# Patient Record
Sex: Female | Born: 1960 | Race: Black or African American | Hispanic: No | Marital: Married | Smoking: Former smoker
Health system: Southern US, Community
[De-identification: ages and names within clinical notes are randomized; demographics above are authoritative.]

## PROBLEM LIST (undated history)

## (undated) DIAGNOSIS — M1990 Unspecified osteoarthritis, unspecified site: Secondary | ICD-10-CM

## (undated) DIAGNOSIS — IMO0002 Reserved for concepts with insufficient information to code with codable children: Secondary | ICD-10-CM

## (undated) DIAGNOSIS — D649 Anemia, unspecified: Secondary | ICD-10-CM

## (undated) DIAGNOSIS — K859 Acute pancreatitis without necrosis or infection, unspecified: Secondary | ICD-10-CM

## (undated) DIAGNOSIS — E119 Type 2 diabetes mellitus without complications: Secondary | ICD-10-CM

## (undated) HISTORY — DX: Unspecified osteoarthritis, unspecified site: M19.90

## (undated) HISTORY — DX: Type 2 diabetes mellitus without complications: E11.9

## (undated) HISTORY — DX: Acute pancreatitis without necrosis or infection, unspecified: K85.90

## (undated) HISTORY — DX: Anemia, unspecified: D64.9

## (undated) HISTORY — DX: Reserved for concepts with insufficient information to code with codable children: IMO0002

## (undated) HISTORY — PX: OOPHORECTOMY: SHX86

---

## 2006-07-30 DIAGNOSIS — IMO0002 Reserved for concepts with insufficient information to code with codable children: Secondary | ICD-10-CM

## 2006-07-30 HISTORY — DX: Reserved for concepts with insufficient information to code with codable children: IMO0002

## 2007-07-31 HISTORY — PX: ABDOMINAL HYSTERECTOMY: SHX81

## 2007-12-15 ENCOUNTER — Other Ambulatory Visit: Payer: Self-pay

## 2007-12-15 ENCOUNTER — Ambulatory Visit: Payer: Self-pay | Admitting: Obstetrics and Gynecology

## 2007-12-18 ENCOUNTER — Inpatient Hospital Stay: Payer: Self-pay | Admitting: Obstetrics and Gynecology

## 2008-01-15 ENCOUNTER — Other Ambulatory Visit: Payer: Self-pay

## 2008-01-15 ENCOUNTER — Ambulatory Visit: Payer: Self-pay | Admitting: Obstetrics and Gynecology

## 2008-09-30 ENCOUNTER — Ambulatory Visit: Payer: Self-pay | Admitting: Family Medicine

## 2009-05-11 ENCOUNTER — Ambulatory Visit: Payer: Self-pay | Admitting: Gastroenterology

## 2010-07-26 ENCOUNTER — Ambulatory Visit: Payer: Self-pay | Admitting: Otolaryngology

## 2010-07-30 DIAGNOSIS — K859 Acute pancreatitis without necrosis or infection, unspecified: Secondary | ICD-10-CM

## 2010-07-30 HISTORY — DX: Acute pancreatitis without necrosis or infection, unspecified: K85.90

## 2011-02-28 ENCOUNTER — Ambulatory Visit: Payer: Self-pay | Admitting: Family Medicine

## 2012-03-18 ENCOUNTER — Ambulatory Visit: Payer: Self-pay | Admitting: Family Medicine

## 2012-04-01 ENCOUNTER — Ambulatory Visit: Payer: Self-pay | Admitting: Family Medicine

## 2013-01-07 ENCOUNTER — Ambulatory Visit: Payer: Self-pay | Admitting: Family Medicine

## 2013-10-05 ENCOUNTER — Ambulatory Visit: Payer: Self-pay | Admitting: Family Medicine

## 2014-02-03 ENCOUNTER — Encounter: Payer: Self-pay | Admitting: Family Medicine

## 2014-02-03 ENCOUNTER — Ambulatory Visit (INDEPENDENT_AMBULATORY_CARE_PROVIDER_SITE_OTHER): Payer: Federal, State, Local not specified - PPO

## 2014-02-03 ENCOUNTER — Ambulatory Visit (INDEPENDENT_AMBULATORY_CARE_PROVIDER_SITE_OTHER): Payer: Federal, State, Local not specified - PPO | Admitting: Family Medicine

## 2014-02-03 VITALS — BP 122/60 | HR 74 | Temp 98.1°F | Resp 16 | Ht 65.5 in | Wt 238.0 lb

## 2014-02-03 DIAGNOSIS — M940 Chondrocostal junction syndrome [Tietze]: Secondary | ICD-10-CM

## 2014-02-03 DIAGNOSIS — S161XXA Strain of muscle, fascia and tendon at neck level, initial encounter: Secondary | ICD-10-CM

## 2014-02-03 DIAGNOSIS — R51 Headache: Secondary | ICD-10-CM

## 2014-02-03 DIAGNOSIS — S139XXA Sprain of joints and ligaments of unspecified parts of neck, initial encounter: Secondary | ICD-10-CM

## 2014-02-03 DIAGNOSIS — R079 Chest pain, unspecified: Secondary | ICD-10-CM

## 2014-02-03 DIAGNOSIS — M542 Cervicalgia: Secondary | ICD-10-CM

## 2014-02-03 DIAGNOSIS — E119 Type 2 diabetes mellitus without complications: Secondary | ICD-10-CM

## 2014-02-03 MED ORDER — METHOCARBAMOL 500 MG PO TABS: 500.0000 mg | ORAL_TABLET | Freq: Every evening | ORAL | Status: DC | PRN

## 2014-02-03 MED ORDER — MELOXICAM 15 MG PO TABS: 15.0000 mg | ORAL_TABLET | Freq: Every day | ORAL | Status: DC

## 2014-02-03 NOTE — Patient Instructions (Signed)
Costochondritis Costochondritis, sometimes called Tietze syndrome, is a swelling and irritation (inflammation) of the tissue (cartilage) that connects your ribs with your breastbone (sternum). It causes pain in the chest and rib area. Costochondritis usually goes away on its own over time. It can take up to 6 weeks or longer to get better, especially if you are unable to limit your activities. CAUSES  Some cases of costochondritis have no known cause. Possible causes include:  Injury (trauma).  Exercise or activity such as lifting.  Severe coughing. SIGNS AND SYMPTOMS  Pain and tenderness in the chest and rib area.  Pain that gets worse when coughing or taking deep breaths.  Pain that gets worse with specific movements. DIAGNOSIS  Your health care provider will do a physical exam and ask about your symptoms. Chest X-rays or other tests may be done to rule out other problems. TREATMENT  Costochondritis usually goes away on its own over time. Your health care provider may prescribe medicine to help relieve pain. HOME CARE INSTRUCTIONS   Avoid exhausting physical activity. Try not to strain your ribs during normal activity. This would include any activities using chest, abdominal, and side muscles, especially if heavy weights are used.  Apply ice to the affected area for the first 2 days after the pain begins.  Put ice in a plastic bag.  Place a towel between your skin and the bag.  Leave the ice on for 20 minutes, 2-3 times a day.  Only take over-the-counter or prescription medicines as directed by your health care provider. SEEK MEDICAL CARE IF:  You have redness or swelling at the rib joints. These are signs of infection.  Your pain does not go away despite rest or medicine. SEEK IMMEDIATE MEDICAL CARE IF:   Your pain increases or you are very uncomfortable.  You have shortness of breath or difficulty breathing.  You cough up blood.  You have worse chest pains,  sweating, or vomiting.  You have a fever or persistent symptoms for more than 2-3 days.  You have a fever and your symptoms suddenly get worse. MAKE SURE YOU:   Understand these instructions.  Will watch your condition.  Will get help right away if you are not doing well or get worse. Document Released: 04/25/2005 Document Revised: 05/06/2013 Document Reviewed: 02/17/2013 Exit[employer] Patient Information 2015 [employer]. This information is not intended to replace advice given to you by your health care provider. Make sure you discuss any questions you have with your health care provider.  

## 2014-02-03 NOTE — Progress Notes (Signed)
This chart was scribed for Ethelda Chick, MD by Ardelia Mems, Scribe. This patient was seen in room 21 and the patient's care was started at 2:49 PM.  Subjective:    Patient ID: Suzanne Young, female    DOB: 1961/06/23, 53 y.o.   MRN: [id number]  02/03/2014  Headache and Chest Pain   HPI  HPI Comments: Suzanne Young is a 53 y.o. female who presents to [facility] to establish care and complaining of an occipital headache for the past 1+ month. She states that the headache was at first intermittent but it is now constant. She describes this pain as pressure and as a numbing pain. She rates the pain at a severity of 1-2/10. She denies having any rash to her scalp. She denies any associated blurry vision or double vision. She denies any difficulty swallowing or speaking.  She does report associated neck pain.  No radiation of pain into arms. No n/t/w in arms. No blurred vision; denies dizziness; no focal weakness.    She has a secondary complaint of intermittent, mild left-sided chest pain over the past month. She rates her pain at a severity of 1-2/10. She describes this pain as "burning". She states that she has had associated nausea over the past month but denies any vomiting. She states that she has been evaluated for this pain by Dr. Dorinda Hill Fisher/PCP- she had a normal EKG and was diagnosed with heartburn. She states that she tried Nexium for 2-3 weeks with some minimal relief of this pain. She is no longer taking Nexium because it did not provide significant enough relief. She states that she occasionally has exertional SOB. She states that she has had a cough recently. She denies any postnasal drip or congestion. She also denies any leg swelling, but notes she occasionally has pain in her calves. She states that she has not had any recent surgeries or prolonged immobilization or sedentary lifestyle.  Chest pain worsens after eating; chest pain also worsens  with position changes like when getting out of bed in the morning or with dressing and bending over.  At recent visit with Dr. Sherrie Mustache, he diagnosed her with diabetes and started her on Metformin. Patient was not fasting for the recent visit with Sherrie Mustache so does not believe the diagnosis.  She however did continue the Metformin because a nurse friend told her that some people lose weight on Metformin.  She is tolerating it without side effects.   She states that she was admitted to the hospital for pancreatitis 2-3 years ago. She also has a history of a gastric ulcer in 2008.   She states that her mother died at age 38 of unknown causes. She states that her mother had a stroke as well as a history of mental illness. She states that her father died of an aneurysm at age 55. She is unsure if this was an abdominal or a brain aneurysm.  She states that her father had no other health problems that she knows of. She states that she has a brother who is healthy with the exception of a stomach issue. She states that she has 2 sisters.   She has been happily married since 2008. She lives wit her husband. She has a son who is 29. 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 She is a former smoker and drinker who quit both smoking and drinking in 1997.    Review of Systems  Constitutional: Negative for fever, chills, diaphoresis, activity change, appetite change and fatigue.  HENT: Negative for congestion, postnasal drip and trouble swallowing.   Eyes: Negative for visual disturbance.  Respiratory: Positive for shortness of breath (occasional, exertional). Negative for cough and stridor.   Cardiovascular: Positive for chest pain. Negative for palpitations and leg swelling.  Gastrointestinal: Positive for nausea. Negative for vomiting, abdominal pain, diarrhea, constipation, blood in stool and rectal pain.  Endocrine: Negative for cold intolerance, heat intolerance, polydipsia, polyphagia and polyuria.    Skin: Negative for rash and wound.  Neurological: Positive for headaches. Negative for dizziness, tremors, seizures, syncope, facial asymmetry, speech difficulty, weakness, light-headedness and numbness.    Past Medical History  Diagnosis Date  . Pancreatitis 07/30/2010  . Anemia   . Ulcer 07/30/2006  . Diabetes mellitus without complication   History reviewed. No pertinent past surgical history.  No Known Allergies Current Outpatient Prescriptions  Medication Sig Dispense Refill  . B Complex Vitamins (B COMPLEX PO) Take by mouth daily.      . metFORMIN (GLUCOPHAGE-XR) 500 MG 24 hr tablet Take 500 mg by mouth daily with breakfast.      . Multiple Vitamin (MULTIVITAMIN) tablet Take 1 tablet by mouth daily.      . meloxicam (MOBIC) 15 MG tablet Take 1 tablet (15 mg total) by mouth daily.  30 tablet  0  . methocarbamol (ROBAXIN) 500 MG tablet Take 1-2 tablets (500-1,000 mg total) by mouth at bedtime as needed for muscle spasms.  40 tablet  0   No current facility-administered medications for this visit.   History   Social History  . Marital Status: Married    Spouse Name: N/A    Number of Children: N/A  . Years of Education: N/A   Occupational History  . Not on file.   Social History Main Topics  . Smoking status: Former Smoker -- 0.50 packs/day for 15 years    Types: Cigarettes    Quit date: 06/30/1995  . Smokeless tobacco: Not on file  . Alcohol Use: No  . Drug Use: Not on file  . Sexual Activity: Not on file   Other Topics Concern  . Not on file   Social History Narrative   Marital status: married since 2008.      Children: 1 son (32).      Lives: with husband.      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        Tobacco: none; quit in 1997      Alcohol:  None; quit 1997.      Exercise: regularly.         Family History  Problem Relation Age of Onset  . Stroke Mother   . Mental illness Mother   . Mental illness Brother         Objective:    BP 122/60  Pulse 74  Temp(Src) 98.1 F (36.7 C) (Oral)  Resp 16  Ht 5' 5.5" (1.664 m)  Wt 238 lb (107.956 kg)  BMI 38.99 kg/m2  SpO2 97%  Physical Exam  Nursing note and vitals reviewed. Constitutional: She is oriented to person, place, and time. She appears well-developed and well-nourished. No distress.  HENT:  Head: Normocephalic and atraumatic.  Right Ear: External ear normal.  Left Ear: External ear normal.  Nose: Nose normal.  Mouth/Throat: Oropharynx is clear and moist.  Tender to palpation along the occipital ridge. Temples non-tender  Eyes: Conjunctivae  and EOM are normal. Pupils are equal, round, and reactive to light.  Neck: Normal range of motion. Neck supple. Carotid bruit is not present. No tracheal deviation present. No thyromegaly present.  Cardiovascular: Normal rate, regular rhythm, normal heart sounds and intact distal pulses.  Exam reveals no gallop and no friction rub.   No murmur heard. Pulmonary/Chest: Effort normal and breath sounds normal. No respiratory distress. She has no wheezes. She has no rales. She exhibits tenderness.  Lungs clear to auscultation Tender to palpation at the left costochondral junction  Abdominal: Soft. Bowel sounds are normal. She exhibits no distension and no mass. There is no tenderness. There is no rebound and no guarding.  Musculoskeletal: Normal range of motion. She exhibits tenderness.       Right shoulder: Normal.       Left shoulder: Normal.       Cervical back: She exhibits tenderness, bony tenderness and pain. She exhibits normal range of motion and no spasm.       Lumbar back: She exhibits normal range of motion, no tenderness, no bony tenderness, no swelling, no pain and no spasm.  Right trapezius tenderness  Lymphadenopathy:    She has no cervical adenopathy.  Neurological: She is alert and oriented to person, place, and time. No cranial nerve deficit.  Skin: Skin is warm and dry. No rash noted. She  is not diaphoretic. No erythema. No pallor.  Psychiatric: She has a normal mood and affect. Her behavior is normal.   No results found for this or any previous visit.  [facility] reading (PRIMARY) by  Dr. Katrinka Blazing.  CXR: NAD;  CERVICAL SPINE FILMS: DEGENERATIVE CHANGES MULTILEVEL.       Assessment & Plan:  Neck pain - Plan: DG Cervical Spine Complete  Headache(784.0)  Chest pain, unspecified chest pain type - Plan: DG Chest 2 View  Costochondritis  Neck strain, initial encounter  Type II or unspecified type diabetes mellitus without mention of complication, not stated as uncontrolled  1.  Headache:  New.  Occipital region; associated with neck pain.  Consistent with neck strain.  Normal neurological exam in office. Rx for Meloxicam and Robaxin; home exercise program provided. 2. Neck pain/DDD cervical spine: New.  With associated headaches.  Rx for Meloxicam and Robaxin provided; recommend passive ROM of neck daily; also recommend heat to neck bid for 15-20 minutes.  If no improvement in 2-3 weeks, refer to ortho. 3.  Chest pain:  New.  Worsens with position changes.  Tenderness at L costochondral junction; consistent with chest wall strain/costochondritis. No exertional component to suggest cardiac etiology. 4. Costochondritis:  New.  Rx for Meloxicam and Robaxin provided.   5.  DMII: New onset; obtain records; RTC 3 weeks for CPE for fasting labs. Continue Metformin.  Meds ordered this encounter  Medications  . metFORMIN (GLUCOPHAGE-XR) 500 MG 24 hr tablet    Sig: Take 500 mg by mouth daily with breakfast.  . Multiple Vitamin (MULTIVITAMIN) tablet    Sig: Take 1 tablet by mouth daily.  . B Complex Vitamins (B COMPLEX PO)    Sig: Take by mouth daily.  . meloxicam (MOBIC) 15 MG tablet    Sig: Take 1 tablet (15 mg total) by mouth daily.    Dispense:  30 tablet    Refill:  0  . methocarbamol (ROBAXIN) 500 MG tablet    Sig: Take 1-2 tablets (500-1,000 mg total) by mouth at bedtime as  needed for muscle spasms.    Dispense:  40 tablet    Refill:  0    No Follow-up on file.   I personally performed the services described in this documentation, which was scribed in my presence.  The recorded information has been reviewed and is accurate.  Nilda SimmerKristi Smith, M.D.  [facility]  [place] [address] [place], [place]  [id number] [phone] phone [phone] fax

## 2014-02-24 ENCOUNTER — Ambulatory Visit (INDEPENDENT_AMBULATORY_CARE_PROVIDER_SITE_OTHER): Payer: Federal, State, Local not specified - PPO | Admitting: Family Medicine

## 2014-02-24 ENCOUNTER — Encounter: Payer: Self-pay | Admitting: Family Medicine

## 2014-02-24 VITALS — BP 142/80 | HR 65 | Temp 98.0°F | Resp 16 | Ht 65.0 in | Wt 239.0 lb

## 2014-02-24 DIAGNOSIS — R7309 Other abnormal glucose: Secondary | ICD-10-CM

## 2014-02-24 DIAGNOSIS — Z23 Encounter for immunization: Secondary | ICD-10-CM

## 2014-02-24 DIAGNOSIS — Z01419 Encounter for gynecological examination (general) (routine) without abnormal findings: Secondary | ICD-10-CM

## 2014-02-24 DIAGNOSIS — R51 Headache: Secondary | ICD-10-CM

## 2014-02-24 DIAGNOSIS — Z13220 Encounter for screening for lipoid disorders: Secondary | ICD-10-CM

## 2014-02-24 DIAGNOSIS — E119 Type 2 diabetes mellitus without complications: Secondary | ICD-10-CM

## 2014-02-24 DIAGNOSIS — Z0000 Encounter for general adult medical examination without abnormal findings: Secondary | ICD-10-CM

## 2014-02-24 DIAGNOSIS — R0782 Intercostal pain: Secondary | ICD-10-CM

## 2014-02-24 DIAGNOSIS — R0789 Other chest pain: Secondary | ICD-10-CM

## 2014-02-24 DIAGNOSIS — M542 Cervicalgia: Secondary | ICD-10-CM

## 2014-02-24 LAB — POCT URINALYSIS DIPSTICK
Bilirubin, UA: NEGATIVE
Blood, UA: NEGATIVE
GLUCOSE UA: NEGATIVE
KETONES UA: NEGATIVE
LEUKOCYTES UA: NEGATIVE
Nitrite, UA: NEGATIVE
PROTEIN UA: NEGATIVE
Spec Grav, UA: <=1.005
Urobilinogen, UA: 0.2
pH, UA: 5

## 2014-02-24 LAB — HEMOGLOBIN A1C
HEMOGLOBIN A1C: 7.3 % — AB (ref <5.7)
Mean Plasma Glucose: 163 mg/dL — ABNORMAL HIGH (ref <117)

## 2014-02-24 LAB — CBC
HEMATOCRIT: 37.8 % (ref 36.0–46.0)
Hemoglobin: 12.9 g/dL (ref 12.0–15.0)
MCH: 29.1 pg (ref 26.0–34.0)
MCHC: 34.1 g/dL (ref 30.0–36.0)
MCV: 85.1 fL (ref 78.0–100.0)
Platelets: 338 10*3/uL (ref 150–400)
RBC: 4.44 MIL/uL (ref 3.87–5.11)
RDW: 14.2 % (ref 11.5–15.5)
WBC: 3.8 10*3/uL — AB (ref 4.0–10.5)
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
## 2014-02-25 LAB — COMPREHENSIVE METABOLIC PANEL
ALT: 27 U/L (ref 0–35)
AST: 17 U/L (ref 0–37)
Albumin: 4.4 g/dL (ref 3.5–5.2)
Alkaline Phosphatase: 57 U/L (ref 39–117)
BUN: 12 mg/dL (ref 6–23)
CALCIUM: 9.7 mg/dL (ref 8.4–10.5)
CO2: 26 meq/L (ref 19–32)
Chloride: 101 mEq/L (ref 96–112)
Creat: 0.67 mg/dL (ref 0.50–1.10)
Glucose, Bld: 164 mg/dL — ABNORMAL HIGH (ref 70–99)
Potassium: 4.1 mEq/L (ref 3.5–5.3)
SODIUM: 138 meq/L (ref 135–145)
TOTAL PROTEIN: 7.5 g/dL (ref 6.0–8.3)
Total Bilirubin: 0.4 mg/dL (ref 0.2–1.2)

## 2014-02-25 LAB — LIPID PANEL
Cholesterol: 227 mg/dL — ABNORMAL HIGH (ref 0–200)
HDL: 58 mg/dL (ref >39)
LDL CALC: 141 mg/dL — AB (ref 0–99)
Total CHOL/HDL Ratio: 3.9 Ratio
Triglycerides: 138 mg/dL (ref <150)
VLDL: 28 mg/dL (ref 0–40)

## 2014-02-25 LAB — TSH: TSH: 0.707 u[IU]/mL (ref 0.350–4.500)

## 2014-03-02 ENCOUNTER — Encounter: Payer: Self-pay | Admitting: *Deleted

## 2014-03-02 NOTE — Addendum Note (Signed)
Addended by: Ethelda ChickSMITH, KRISTI M on: 03/02/2014 12:24 PM   Modules accepted: Orders

## 2014-03-12 LAB — HM MAMMOGRAPHY: HM Mammogram: NEGATIVE

## 2014-03-18 ENCOUNTER — Telehealth: Payer: Self-pay

## 2014-03-18 NOTE — Telephone Encounter (Signed)
Called and informed pt of normal mammogram.

## 2014-03-26 ENCOUNTER — Encounter: Payer: Self-pay | Admitting: Radiology

## 2014-05-24 ENCOUNTER — Encounter: Payer: Federal, State, Local not specified - PPO | Admitting: Family Medicine

## 2014-05-27 NOTE — Progress Notes (Signed)
This encounter was created in error - please disregard.

## 2015-10-18 ENCOUNTER — Ambulatory Visit (INDEPENDENT_AMBULATORY_CARE_PROVIDER_SITE_OTHER): Payer: Federal, State, Local not specified - PPO | Admitting: Family Medicine

## 2015-10-18 ENCOUNTER — Encounter: Payer: Self-pay | Admitting: Family Medicine

## 2015-10-18 VITALS — BP 127/84 | HR 79 | Temp 98.4°F | Resp 16 | Ht 65.0 in | Wt 237.0 lb

## 2015-10-18 DIAGNOSIS — Z13220 Encounter for screening for lipoid disorders: Secondary | ICD-10-CM

## 2015-10-18 DIAGNOSIS — Z0000 Encounter for general adult medical examination without abnormal findings: Secondary | ICD-10-CM

## 2015-10-18 DIAGNOSIS — Z1239 Encounter for other screening for malignant neoplasm of breast: Secondary | ICD-10-CM

## 2015-10-18 DIAGNOSIS — E119 Type 2 diabetes mellitus without complications: Secondary | ICD-10-CM | POA: Diagnosis not present

## 2015-10-18 DIAGNOSIS — Z23 Encounter for immunization: Secondary | ICD-10-CM | POA: Diagnosis not present

## 2015-10-18 DIAGNOSIS — Z1159 Encounter for screening for other viral diseases: Secondary | ICD-10-CM | POA: Diagnosis not present

## 2015-10-18 DIAGNOSIS — Z114 Encounter for screening for human immunodeficiency virus [HIV]: Secondary | ICD-10-CM

## 2015-10-18 LAB — POCT URINALYSIS DIP (MANUAL ENTRY)
Bilirubin, UA: NEGATIVE
GLUCOSE UA: NEGATIVE
Ketones, POC UA: NEGATIVE
Leukocytes, UA: NEGATIVE
NITRITE UA: NEGATIVE
PH UA: 6.5
PROTEIN UA: NEGATIVE
Spec Grav, UA: 1.02
Urobilinogen, UA: 0.2

## 2015-10-18 LAB — CBC WITH DIFFERENTIAL/PLATELET
BASOS ABS: 0 10*3/uL (ref 0.0–0.1)
Basophils Relative: 0 % (ref 0–1)
Eosinophils Absolute: 0.1 10*3/uL (ref 0.0–0.7)
Eosinophils Relative: 2 % (ref 0–5)
HEMATOCRIT: 36.7 % (ref 36.0–46.0)
HEMOGLOBIN: 12.5 g/dL (ref 12.0–15.0)
LYMPHS ABS: 2 10*3/uL (ref 0.7–4.0)
LYMPHS PCT: 52 % — AB (ref 12–46)
MCH: 28.7 pg (ref 26.0–34.0)
MCHC: 34.1 g/dL (ref 30.0–36.0)
MCV: 84.4 fL (ref 78.0–100.0)
MPV: 9.3 fL (ref 8.6–12.4)
Monocytes Absolute: 0.3 10*3/uL (ref 0.1–1.0)
Monocytes Relative: 9 % (ref 3–12)
NEUTROS ABS: 1.4 10*3/uL — AB (ref 1.7–7.7)
Neutrophils Relative %: 37 % — ABNORMAL LOW (ref 43–77)
Platelets: 297 10*3/uL (ref 150–400)
RBC: 4.35 MIL/uL (ref 3.87–5.11)
RDW: 13.8 % (ref 11.5–15.5)
WBC: 3.8 10*3/uL — AB (ref 4.0–10.5)

## 2015-10-18 LAB — POCT GLYCOSYLATED HEMOGLOBIN (HGB A1C): HEMOGLOBIN A1C: 9.1

## 2015-10-18 LAB — TSH: TSH: 0.56 mIU/L

## 2015-10-18 NOTE — Patient Instructions (Addendum)
   IF you received an x-ray today, you will receive an invoice from [facility]. Please contact [facility] at [phone] with questions or concerns regarding your invoice.   IF you received labwork today, you will receive an invoice from [facility]. Please contact [facility] at [phone] with questions or concerns regarding your invoice.   Our billing staff will not be able to assist you with questions regarding bills from these companies.  You will be contacted with the lab results as soon as they are available. The fastest way to get your results is to activate your My Chart account. Instructions are located on the last page of this paperwork. If you have not heard from us regarding the results in 2 weeks, please contact this office.    Keeping You Healthy  Get These Tests  Blood Pressure- Have your blood pressure checked by your healthcare provider at least once a year.  Normal blood pressure is 120/80.  Weight- Have your body mass index (BMI) calculated to screen for obesity.  BMI is a measure of body fat based on height and weight.  You can calculate your own BMI at www.nhlbisupport.com/bmi/  Cholesterol- Have your cholesterol checked every year.  Diabetes- Have your blood sugar checked every year if you have high blood pressure, high cholesterol, a family history of diabetes or if you are overweight.  Pap Test - Have a pap test every 1 to 5 years if you have been sexually active.  If you are older than 65 and recent pap tests have been normal you may not need additional pap tests.  In addition, if you have had a hysterectomy  for benign disease additional pap tests are not necessary.  Mammogram-Yearly mammograms are essential for early detection of breast cancer  Screening for Colon Cancer- Colonoscopy starting at age 50. Screening may begin sooner depending on your family history and other health conditions.  Follow up colonoscopy  as directed by your Gastroenterologist.  Screening for Osteoporosis- Screening begins at age 65 with bone density scanning, sooner if you are at higher risk for developing Osteoporosis.  Get these medicines  Calcium with Vitamin D- Your body requires 1200-1500 mg of Calcium a day and 800-1000 IU of Vitamin D a day.  You can only absorb 500 mg of Calcium at a time therefore Calcium must be taken in 2 or 3 separate doses throughout the day.  Hormones- Hormone therapy has been associated with increased risk for certain cancers and heart disease.  Talk to your healthcare provider about if you need relief from menopausal symptoms.  Aspirin- Ask your healthcare provider about taking Aspirin to prevent Heart Disease and Stroke.  Get these Immuniztions  Flu shot- Every fall  Pneumonia shot- Once after the age of 65; if you are younger ask your healthcare provider if you need a pneumonia shot.  Tetanus- Every ten years.  Zostavax- Once after the age of 60 to prevent shingles.  Take these steps  Don't smoke- Your healthcare provider can help you quit. For tips on how to quit, ask your healthcare provider or go to www.smokefree.gov or call 1-800 QUIT-NOW.  Be physically active- Exercise 5 days a week for a minimum of 30 minutes.  If you are not already physically active, start slow and gradually work up to 30 minutes of moderate physical activity.  Try walking, dancing, bike riding, swimming, etc.  Eat a healthy diet- Eat a variety of healthy foods such as fruits, vegetables, whole   grains, low fat milk, low fat cheeses, yogurt, lean meats, chicken, fish, eggs, dried beans, tofu, etc.  For more information go to www.thenutritionsource.org  Dental visit- Brush and floss teeth twice daily; visit your dentist twice a year.  Eye exam- Visit your Optometrist or Ophthalmologist yearly.  Drink alcohol in moderation- Limit alcohol intake to one drink or less a day.  Never drink and  drive.  Depression- Your emotional health is as important as your physical health.  If you're feeling down or losing interest in things you normally enjoy, please talk to your healthcare provider.  Seat Belts- can save your life; always wear one  Smoke/Carbon Monoxide detectors- These detectors need to be installed on the appropriate level of your home.  Replace batteries at least once a year.  Violence- If anyone is threatening or hurting you, please tell your healthcare provider.  Living Will/ Health care power of attorney- Discuss with your healthcare provider and family. 
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
## 2015-10-18 NOTE — Progress Notes (Signed)
   Subjective:    Patient ID: Suzanne Young, female    DOB: 12/20/1960, 55 y.o.   MRN: [id number]  HPI    Review of Systems  Constitutional: Positive for diaphoresis and fatigue.  HENT: Negative.   Eyes: Positive for visual disturbance.  Respiratory: Negative.   Cardiovascular: Negative.   Gastrointestinal: Negative.   Endocrine: Negative.   Genitourinary: Positive for vaginal pain.  Musculoskeletal: Positive for arthralgias.  Skin: Negative.   Allergic/Immunologic: Negative.   Neurological: Negative.   Hematological: Negative.   Psychiatric/Behavioral: Negative.        Objective:   Physical Exam        Assessment & Plan:

## 2015-10-19 LAB — HEPATITIS C ANTIBODY: HCV AB: NEGATIVE

## 2015-10-19 LAB — MICROALBUMIN, URINE: Microalb, Ur: 0.2 mg/dL

## 2015-10-19 LAB — LIPID PANEL
CHOLESTEROL: 216 mg/dL — AB (ref 125–200)
HDL: 47 mg/dL (ref >=46)
LDL CALC: 142 mg/dL — AB (ref <130)
TRIGLYCERIDES: 135 mg/dL (ref <150)
Total CHOL/HDL Ratio: 4.6 Ratio (ref <=5.0)
VLDL: 27 mg/dL (ref <30)

## 2015-10-19 LAB — COMPREHENSIVE METABOLIC PANEL
ALBUMIN: 4.2 g/dL (ref 3.6–5.1)
ALK PHOS: 63 U/L (ref 33–130)
ALT: 20 U/L (ref 6–29)
AST: 14 U/L (ref 10–35)
BILIRUBIN TOTAL: 0.5 mg/dL (ref 0.2–1.2)
BUN: 11 mg/dL (ref 7–25)
CALCIUM: 9.1 mg/dL (ref 8.6–10.4)
CO2: 23 mmol/L (ref 20–31)
Chloride: 102 mmol/L (ref 98–110)
Creat: 0.61 mg/dL (ref 0.50–1.05)
Glucose, Bld: 188 mg/dL — ABNORMAL HIGH (ref 65–99)
Potassium: 3.9 mmol/L (ref 3.5–5.3)
Sodium: 137 mmol/L (ref 135–146)
TOTAL PROTEIN: 7.1 g/dL (ref 6.1–8.1)

## 2015-10-19 LAB — HIV ANTIBODY (ROUTINE TESTING W REFLEX): HIV 1&2 Ab, 4th Generation: NONREACTIVE

## 2015-10-23 IMAGING — CR DG CERVICAL SPINE COMPLETE 4+V
4 series · 4 of 4 positions shown · non-contrast
Comparison: None.

CLINICAL DATA: Neck pain

EXAM:
CERVICAL SPINE  4+ VIEWS

[lateral]
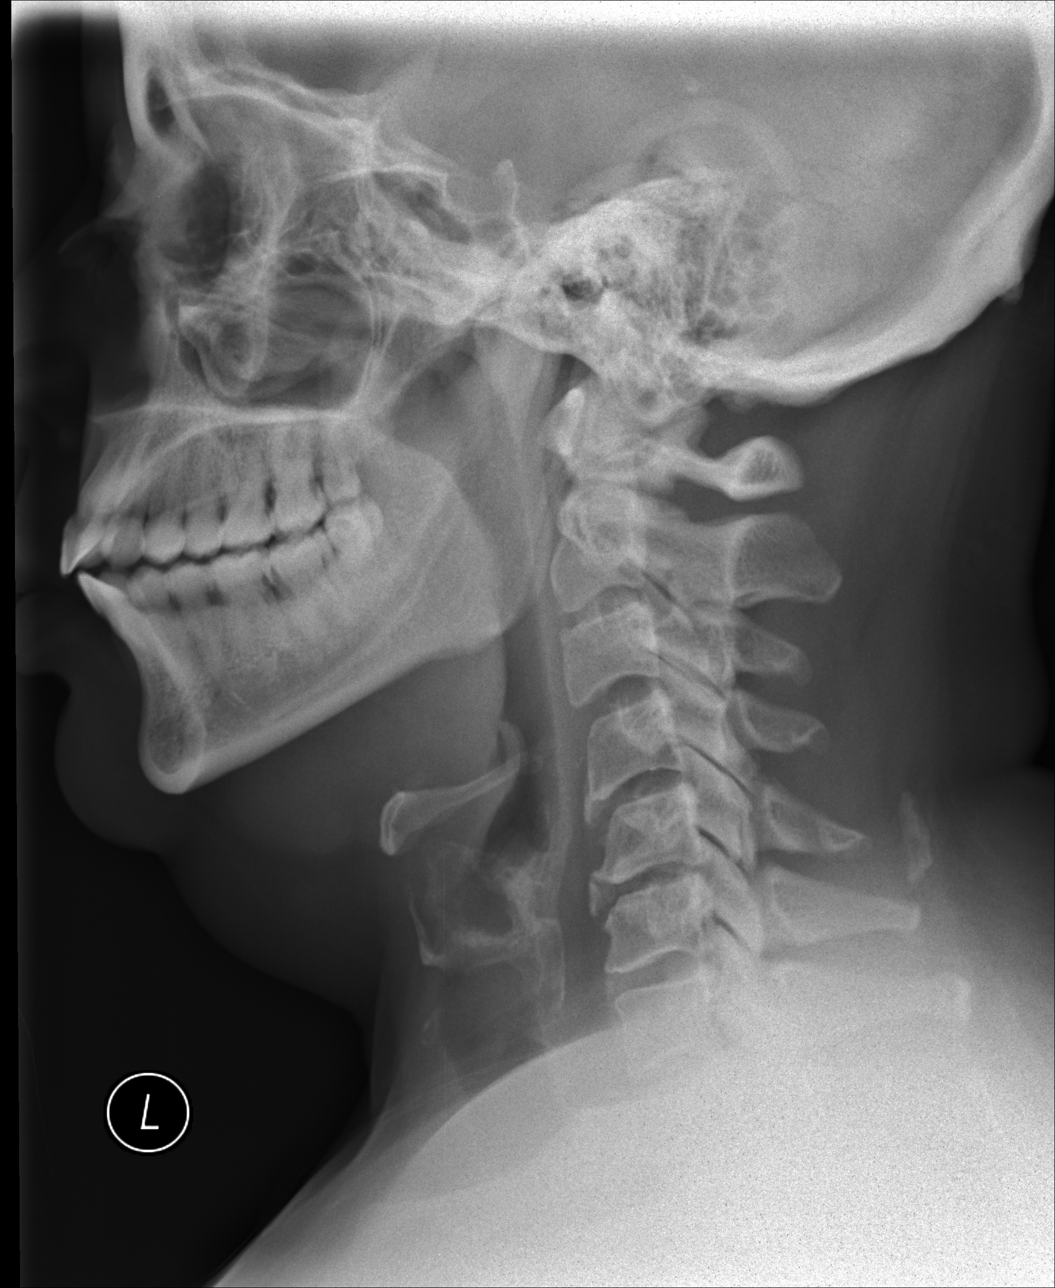

[oblique (1 of 2)]
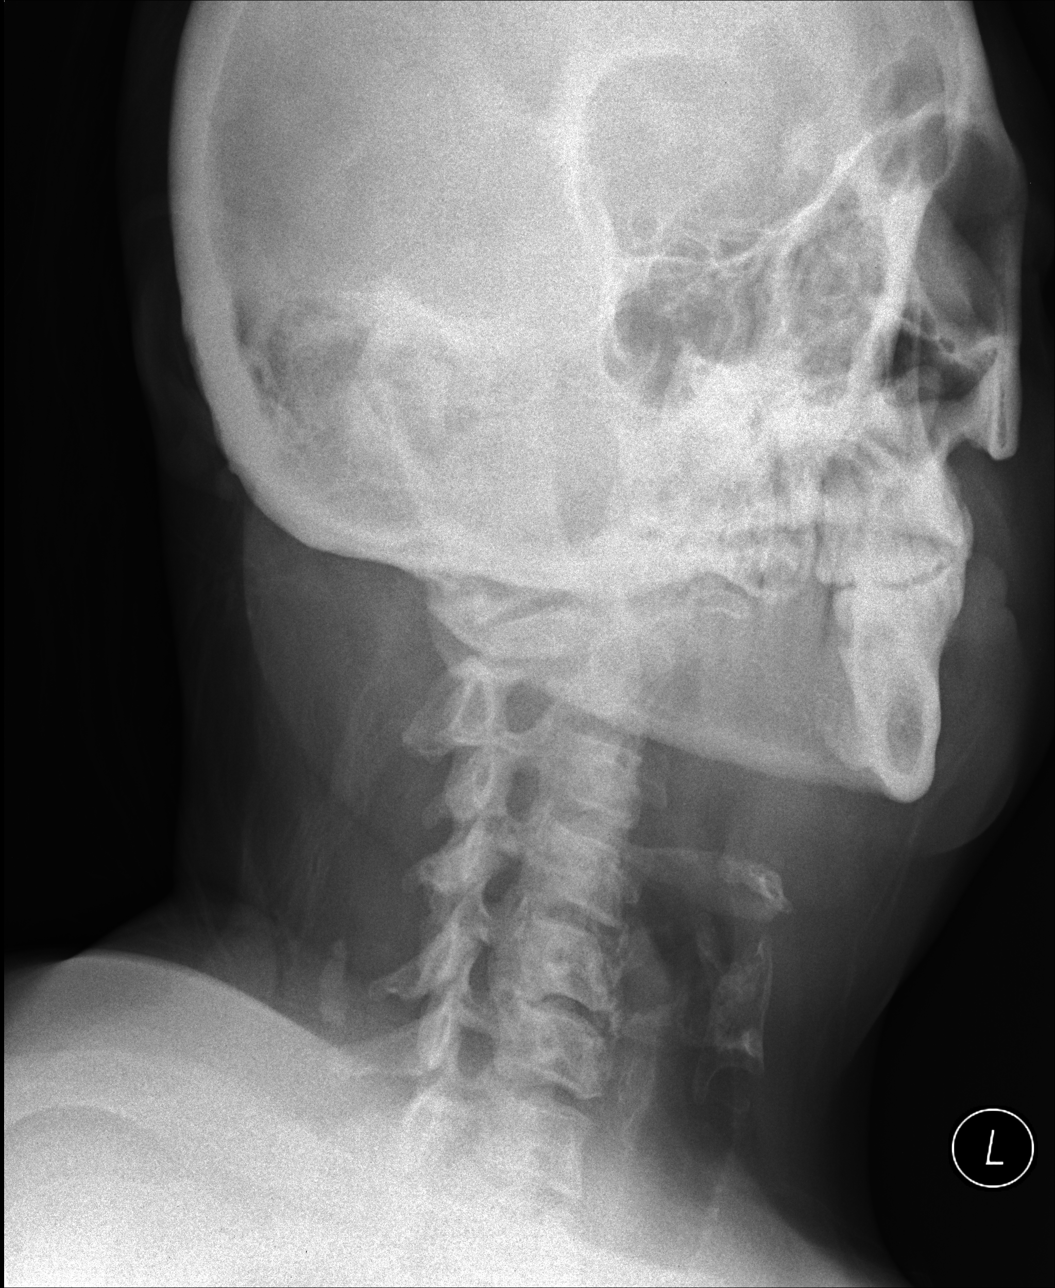

[AP]
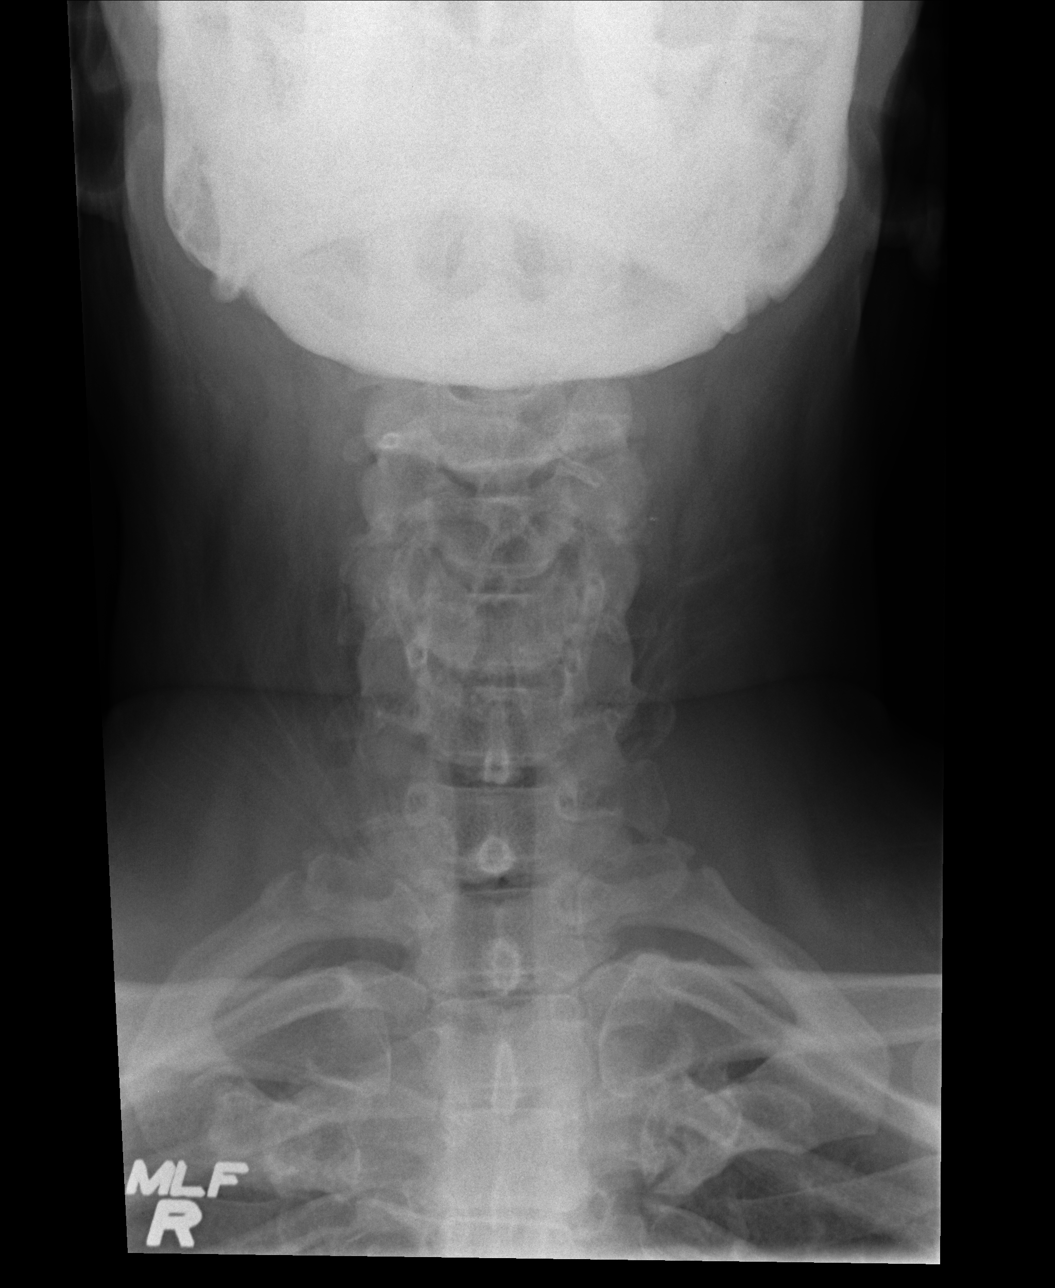

[oblique (2 of 2)]
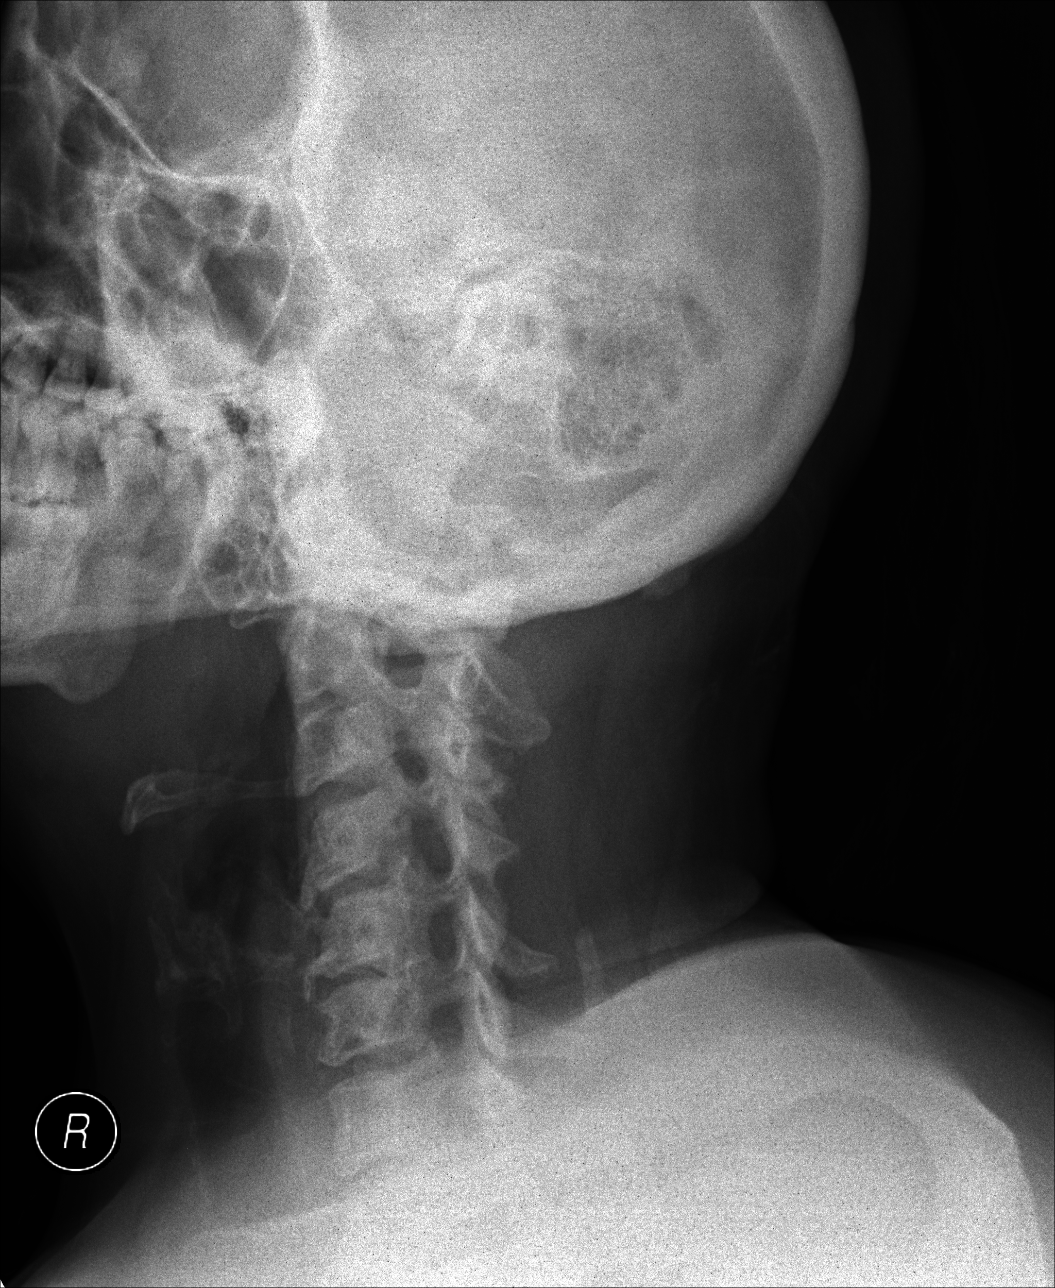

[4 of 4 positions shown; findings below may reference images not displayed]

FINDINGS: Frontal, lateral, and bilateral oblique views were obtained. There
is no fracture or spondylolisthesis. Prevertebral soft tissues and
predental space regions are normal. There is moderate disc space
narrowing at C5-6. There is milder disc space narrowing at C4-5.
There are calcifications in the anterior ligament at C4-5 and C5-6.
There is calcification in the nuchal ligament posteriorly at C5-6.
There is facet osteoarthritic change at C4-5 and C5-6 bilaterally.
IMPRESSION: Osteoarthritic change at several levels. No fracture or
spondylolisthesis.

## 2015-10-31 MED ORDER — METFORMIN HCL ER (MOD) 500 MG PO TB24: 500.0000 mg | ORAL_TABLET | Freq: Two times a day (BID) | ORAL | Status: DC

## 2015-10-31 NOTE — Addendum Note (Signed)
Addended by: Ethelda ChickSMITH, Tadarius Maland M on: 10/31/2015 10:06 PM   Modules accepted: Orders

## 2015-11-02 ENCOUNTER — Other Ambulatory Visit: Payer: Self-pay

## 2015-11-02 MED ORDER — METFORMIN HCL ER 500 MG PO TB24: 500.0000 mg | ORAL_TABLET | Freq: Two times a day (BID) | ORAL | Status: DC

## 2015-11-15 ENCOUNTER — Encounter: Payer: Federal, State, Local not specified - PPO | Attending: Family Medicine | Admitting: Dietician

## 2015-11-15 VITALS — BP 130/82 | Ht 65.0 in | Wt 235.7 lb

## 2015-11-15 DIAGNOSIS — E119 Type 2 diabetes mellitus without complications: Secondary | ICD-10-CM

## 2015-11-15 NOTE — Patient Instructions (Signed)
  Check blood sugars 2 x day before breakfast and 2 hrs after supper every day  Exercise:  Continue walking 5x/wk.   Avoid sugar sweetened drinks (soda, tea, coffee, sports drinks, juices) Limit intake of sweets and fried foods  Eat 3 meals day,   3  snacks a day  Space meals 4-6 hours apart  Bring blood sugar records to the next appointment/class  Call your doctor for a prescription for:  1. Meter strips (type)  Ultra One Touch test strips  checking  2 times per day  2. Lancets (type) One Touch Delica Lancets  checking  2   times per day  Get a Sharps container  Return for appointment/classes on:  12-01-15  Call if questions arise

## 2015-11-15 NOTE — Progress Notes (Signed)
Diabetes Self-Management Education  Visit Type: First/Initial  Appt. Start Time: 1040 Appt. End Time: 1145  11/15/2015  Ms. Suzanne Young, identified by name and date of birth, is a 55 y.o. female with a diagnosis of Diabetes: Type 2.   ASSESSMENT  Blood pressure 130/82, height  (1.651 m), weight 235 lb 11.2 oz (106.913 kg). Body mass index is 39.22 kg/(m^2). Lacks knowledge and understnading of diabetes care and diet Obesity Does not have a BG meter to test BG's      Diabetes Self-Management Education - 11/15/15 1217    Visit Information   Visit Type First/Initial   Initial Visit   Diabetes Type Type 2   Health Coping   How would you rate your overall health? Good   Psychosocial Assessment   Patient Belief/Attitude about Diabetes Motivated to manage diabetes   Self-care barriers None   Patient Concerns Nutrition/Meal planning;Glycemic Control;Weight Control;Healthy Lifestyle  become more fit   Special Needs None   Preferred Learning Style Visual   Learning Readiness Ready   What is the last grade level you completed in school? 12+ college   Complications   How often do you check your blood sugar? 0 times/day (not testing)   Have you had a dilated eye exam in the past 12 months? Yes  01-2015   Have you had a dental exam in the past 12 months? Yes  08-2015   Are you checking your feet? Yes   How many days per week are you checking your feet? 7   Dietary Intake   Breakfast --  pt recently decreased intake of fried foods and sweets and increased intake of vegetables; eats  breakfast at 6am before starting work ([employer])    Snack (morning) --  eats 9am snack of apple + 1 plum   Lunch --  eats lunch at 11am-usually drinks a protein shake (has 6 carb grams and 20 grams of protein)   Snack (afternoon) --  eats snacks at 12p and 1P (fruit or small amount of wheat thins)   Dinner --  supper time varies: 3p-6p   Snack (evening) --  none   Beverage(s) --  drinks 8+ glasses of water/day and 8+ glasses ofsugar free beverages/day   Exercise   Exercise Type ADL's  walks 30-60 min 5x/wk   Patient Education   Previous Diabetes Education No   Disease state  Definition of diabetes, type 1 and 2, and the diagnosis of diabetes;Factors that contribute to the development of diabetes   Nutrition management  Role of diet in the treatment of diabetes and the relationship between the three main macronutrients and blood glucose level;Food label reading, portion sizes and measuring food.;Carbohydrate counting   Physical activity and exercise  Role of exercise on diabetes management, blood pressure control and cardiac health.   Monitoring Taught/evaluated SMBG meter.;Purpose and frequency of SMBG.;Identified appropriate SMBG and/or A1C goals.;Taught/discussed recording of test results and interpretation of SMBG.;Yearly dilated eye exam  gave pt Ultra One Touch Mini meter and instructed on its use; BG 3 hr pp=248   Chronic complications Dental care;Retinopathy and reason for yearly dilated eye exams;Relationship between chronic complications and blood glucose control   Psychosocial adjustment Role of stress on diabetes   Personal strategies to promote health Lifestyle issues that need to be addressed for better diabetes care      Individualized Plan for Diabetes Self-Management Training:   Learning Objective:  Patient will have a greater understanding of diabetes self-management.  Patient education plan is to attend individual and/or group sessions per assessed needs and concerns.   Plan:   Patient Instructions   Check blood sugars 2 x day before breakfast and 2 hrs after supper every day  Exercise:  Continue walking 5x/wk.   Avoid sugar sweetened drinks (soda, tea, coffee, sports drinks, juices) Limit intake of sweets and fried foods  Eat 3 meals day,   3  snacks a day  Space meals 4-6 hours apart  Bring blood sugar records to the  next appointment/class  Call your doctor for a prescription for:  1. Meter strips (type)  Ultra One Touch test strips  checking  2 times per day  2. Lancets (type) One Touch Delica Lancets  checking  2   times per day  Get a Sharps container  Return for appointment/classes on:  12-01-15  Call if questions arise   Expected Outcomes:   positive  Education material provided: General Meal Planning Guidelines, Ultra One Touch Mini meter  If problems or questions, patient to contact team via:  [phone]  Future DSME appointment:  12-01-15

## 2015-12-01 ENCOUNTER — Ambulatory Visit: Payer: Federal, State, Local not specified - PPO

## 2015-12-01 ENCOUNTER — Encounter: Payer: Self-pay | Admitting: *Deleted

## 2015-12-01 NOTE — Progress Notes (Signed)
Pt missed Class 1 today. Called her phone number x 2 with no answering machine. Unable to leave a message.

## 2015-12-08 ENCOUNTER — Ambulatory Visit: Payer: Federal, State, Local not specified - PPO

## 2015-12-13 ENCOUNTER — Encounter: Payer: Self-pay | Admitting: Dietician

## 2015-12-13 NOTE — Progress Notes (Signed)
Called pt about rescheduling diabetes classes-pt doesn't want to return for classes-pt discharged

## 2015-12-15 ENCOUNTER — Ambulatory Visit: Payer: Federal, State, Local not specified - PPO

## 2016-08-14 ENCOUNTER — Ambulatory Visit: Payer: Federal, State, Local not specified - PPO | Admitting: Family Medicine

## 2016-08-15 ENCOUNTER — Ambulatory Visit: Payer: Federal, State, Local not specified - PPO | Admitting: Family Medicine

## 2017-01-18 DIAGNOSIS — L821 Other seborrheic keratosis: Secondary | ICD-10-CM | POA: Diagnosis not present

## 2017-06-12 ENCOUNTER — Ambulatory Visit: Payer: Federal, State, Local not specified - PPO | Admitting: Physician Assistant

## 2017-06-12 ENCOUNTER — Encounter: Payer: Self-pay | Admitting: Physician Assistant

## 2017-06-12 ENCOUNTER — Other Ambulatory Visit: Payer: Self-pay

## 2017-06-12 ENCOUNTER — Ambulatory Visit (INDEPENDENT_AMBULATORY_CARE_PROVIDER_SITE_OTHER): Payer: Federal, State, Local not specified - PPO

## 2017-06-12 VITALS — BP 110/72 | HR 80 | Temp 99.8°F | Resp 18 | Ht 65.0 in | Wt 240.6 lb

## 2017-06-12 DIAGNOSIS — M79644 Pain in right finger(s): Secondary | ICD-10-CM

## 2017-06-12 DIAGNOSIS — S8012XA Contusion of left lower leg, initial encounter: Secondary | ICD-10-CM | POA: Diagnosis not present

## 2017-06-12 DIAGNOSIS — M79662 Pain in left lower leg: Secondary | ICD-10-CM | POA: Diagnosis not present

## 2017-06-12 DIAGNOSIS — M25561 Pain in right knee: Secondary | ICD-10-CM

## 2017-06-12 NOTE — Progress Notes (Signed)
[facility] [address], [place] [phone]  Date:  06/12/2017   Name:  Suzanne Young   DOB:  03/29/1961   MRN:  [id number]  PCP:  Ethelda ChickSmith, Kristi M, MD    History of Present Illness:  Suzanne Young is a 56 y.o. female patient who presents to PCP with  Chief Complaint  Patient presents with  . Knee Injury    pt hit right knee trying to get into a truck and is bruised but both knees are sore      She was getting into truck, foot slipped--and she fell into the truck striking her knee.  She has aching that she noticed today.  She has noticed some swelling.  She felt no pop.  She had an injury of her right knee 3 years ago.  This xray revealed degenreative changes without acute abnormality.   She notes that she stopped the fall with her knee and her arms.  She has some wrist soreness.    There are no active problems to display for this patient.   Past Medical History:  Diagnosis Date  . Anemia   . Arthritis    DDD cervical spine  . Diabetes mellitus without complication (HCC)   . Pancreatitis 07/30/2010  . Ulcer 07/30/2006   PUD    Past Surgical History:  Procedure Laterality Date  . ABDOMINAL HYSTERECTOMY  07/31/2007   DUB/fibroids.  Ovary status unknown.    Social History   Tobacco Use  . Smoking status: Former Smoker    Packs/day: 0.50    Years: 15.00    Pack years: 7.50    Types: Cigarettes    Last attempt to quit: 06/30/1995    Years since quitting: 21.9  . Smokeless tobacco: Never Used  Substance Use Topics  . Alcohol use: No  . Drug use: Not on file    Family History  Problem Relation Age of Onset  . Stroke Mother   . Mental illness Mother   . Mental illness Brother   . Arthritis Sister     No Known Allergies  Medication list has been reviewed and updated.  Current Outpatient Medications on File Prior to Visit  Medication Sig Dispense Refill  . B Complex Vitamins (B COMPLEX PO) Take by mouth daily. Reported  on 11/15/2015    . CINNAMON PO Take 1 capsule by mouth 2 (two) times daily.    . Multiple Vitamin (MULTIVITAMIN) tablet Take 1 tablet by mouth daily.    . meloxicam (MOBIC) 15 MG tablet Take 1 tablet (15 mg total) by mouth daily. (Patient not taking: Reported on 10/18/2015) 30 tablet 0  . metFORMIN (GLUCOPHAGE-XR) 500 MG 24 hr tablet Take 1 tablet (500 mg total) by mouth 2 (two) times daily with a meal. (Patient not taking: Reported on 11/15/2015) 180 tablet 1  . methocarbamol (ROBAXIN) 500 MG tablet Take 1-2 tablets (500-1,000 mg total) by mouth at bedtime as needed for muscle spasms. (Patient not taking: Reported on 10/18/2015) 40 tablet 0   No current facility-administered medications on file prior to visit.     ROS ROS otherwise unremarkable unless listed above.  Physical Examination: BP 110/72   Pulse 80   Temp 99.8 F (37.7 C) (Oral)   Resp 18   Ht 5\' 5"  (1.651 m)   Wt 240 lb 9.6 oz (109.1 kg)   SpO2 97%   BMI 40.04 kg/m  Ideal Body Weight: Weight in (lb) to have BMI =  25: 149.9  Physical Exam  Constitutional: She is oriented to person, place, and time. She appears well-developed and well-nourished. No distress.  HENT:  Head: Normocephalic and atraumatic.  Right Ear: External ear normal.  Left Ear: External ear normal.  Eyes: Conjunctivae and EOM are normal. Pupils are equal, round, and reactive to light.  Cardiovascular: Normal rate.  Pulmonary/Chest: Effort normal. No respiratory distress.  Musculoskeletal:       Right knee: She exhibits ecchymosis (faint just medial inferior to the patella with tenderness at this  site. ). She exhibits normal range of motion.  Neurological: She is alert and oriented to person, place, and time.  Skin: She is not diaphoretic.  Psychiatric: She has a normal mood and affect. Her behavior is normal.    Dg Tibia/fibula Left  Result Date: 06/12/2017 CLINICAL DATA:  Left should pain after injury falling off work truck. EXAM: LEFT TIBIA AND  FIBULA - 2 VIEW COMPARISON:  None. FINDINGS: There is no evidence of fracture or other focal bone lesions. Soft tissues are unremarkable. IMPRESSION: Normal left tibia and fibula. Electronically Signed   By: Lupita RaiderJames  Green Jr, M.D.   On: 06/12/2017 16:50   Dg Knee Complete 4 Views Right  Result Date: 06/12/2017 CLINICAL DATA:  Right knee pain after fall out of truck last night. EXAM: RIGHT KNEE - COMPLETE 4+ VIEW COMPARISON:  Radiographs of October 05, 2013. FINDINGS: No evidence of fracture, dislocation, or joint effusion. Mild narrowing and osteophyte formation is seen involving the medial joint space. Mild spurring of superior aspect of patella is noted. Soft tissues are unremarkable. IMPRESSION: Mild degenerative joint disease. No acute abnormality seen in the right knee. Electronically Signed   By: Lupita RaiderJames  Green Jr, M.D.   On: 06/12/2017 16:48    Assessment and Plan: Suzanne Young is a 56 y.o. female who is here today for cc of  Chief Complaint  Patient presents with  . Knee Injury    pt hit right knee trying to get into a truck and is bruised but both knees are sore    Rice advised.   Tylenol for pain. Advised to return in 10 days for follow up.  Acute pain of right knee - Plan: DG Knee Complete 4 Views Right  Pain in left shin - Plan: DG Tibia/Fibula Left  Finger pain, right - Plan: DG Finger Little Right  Contusion of left lower leg, initial encounter  Trena PlattStephanie Adina Puzzo, PA-C [facility] 11/20/201810:49 AM

## 2017-06-12 NOTE — Patient Instructions (Addendum)
Please ice the knee three times per day for 15 minutes.   I would like you to use tylenol and return in 10 days for follow up.   Your xrays are not remarkable.    Contusion A contusion is a deep bruise. Contusions happen when an injury causes bleeding under the skin. Symptoms of bruising include pain, swelling, and discolored skin. The skin may turn blue, purple, or yellow. Follow these instructions at home:  Rest the injured area.  If told, put ice on the injured area. ? Put ice in a plastic bag. ? Place a towel between your skin and the bag. ? Leave the ice on for 20 minutes, 2-3 times per day.  If told, put light pressure (compression) on the injured area using an elastic bandage. Make sure the bandage is not too tight. Remove it and put it back on as told by your doctor.  If possible, raise (elevate) the injured area above the level of your heart while you are sitting or lying down.  Take over-the-counter and prescription medicines only as told by your doctor. Contact a doctor if:  Your symptoms do not get better after several days of treatment.  Your symptoms get worse.  You have trouble moving the injured area. Get help right away if:  You have very bad pain.  You have a loss of feeling (numbness) in a hand or foot.  Your hand or foot turns pale or cold. This information is not intended to replace advice given to you by your health care provider. Make sure you discuss any questions you have with your health care provider. Document Released: 01/02/2008 Document Revised: 12/22/2015 Document Reviewed: 12/01/2014 Elsevier Interactive Patient Education  2018 [employer]

## 2017-11-06 ENCOUNTER — Encounter: Payer: Self-pay | Admitting: Physician Assistant

## 2017-12-23 ENCOUNTER — Encounter: Payer: Self-pay | Admitting: Family Medicine

## 2018-02-03 ENCOUNTER — Other Ambulatory Visit: Payer: Self-pay

## 2018-02-03 ENCOUNTER — Ambulatory Visit: Payer: Self-pay

## 2018-02-03 ENCOUNTER — Ambulatory Visit: Payer: Federal, State, Local not specified - PPO | Admitting: Family Medicine

## 2018-02-03 ENCOUNTER — Encounter: Payer: Self-pay | Admitting: Family Medicine

## 2018-02-03 VITALS — BP 162/88 | HR 80 | Temp 98.8°F | Resp 16 | Ht 65.0 in | Wt 236.8 lb

## 2018-02-03 DIAGNOSIS — R030 Elevated blood-pressure reading, without diagnosis of hypertension: Secondary | ICD-10-CM | POA: Diagnosis not present

## 2018-02-03 DIAGNOSIS — J301 Allergic rhinitis due to pollen: Secondary | ICD-10-CM

## 2018-02-03 DIAGNOSIS — J0100 Acute maxillary sinusitis, unspecified: Secondary | ICD-10-CM

## 2018-02-03 DIAGNOSIS — R05 Cough: Secondary | ICD-10-CM | POA: Diagnosis not present

## 2018-02-03 DIAGNOSIS — Z1239 Encounter for other screening for malignant neoplasm of breast: Secondary | ICD-10-CM

## 2018-02-03 DIAGNOSIS — Z1231 Encounter for screening mammogram for malignant neoplasm of breast: Secondary | ICD-10-CM

## 2018-02-03 DIAGNOSIS — R059 Cough, unspecified: Secondary | ICD-10-CM

## 2018-02-03 DIAGNOSIS — E119 Type 2 diabetes mellitus without complications: Secondary | ICD-10-CM

## 2018-02-03 DIAGNOSIS — Z6839 Body mass index (BMI) 39.0-39.9, adult: Secondary | ICD-10-CM

## 2018-02-03 LAB — POCT URINALYSIS DIP (MANUAL ENTRY)
BILIRUBIN UA: NEGATIVE
BILIRUBIN UA: NEGATIVE mg/dL
Blood, UA: NEGATIVE
Glucose, UA: 500 mg/dL — AB
Leukocytes, UA: NEGATIVE
Nitrite, UA: NEGATIVE
PH UA: 5.5 (ref 5.0–8.0)
Protein Ur, POC: NEGATIVE mg/dL
Urobilinogen, UA: 0.2 E.U./dL

## 2018-02-03 LAB — POCT GLYCOSYLATED HEMOGLOBIN (HGB A1C): HEMOGLOBIN A1C: 9.3 % — AB (ref 4.0–5.6)

## 2018-02-03 LAB — POCT CBC
Granulocyte percent: 40.8 %G (ref 37–80)
HCT, POC: 37.4 % — AB (ref 37.7–47.9)
Hemoglobin: 11.9 g/dL — AB (ref 12.2–16.2)
LYMPH, POC: 2.6 (ref 0.6–3.4)
MCH, POC: 27.2 pg (ref 27–31.2)
MCHC: 32 g/dL (ref 31.8–35.4)
MCV: 85.1 fL (ref 80–97)
MID (CBC): 0.2 (ref 0–0.9)
MPV: 7.2 fL (ref 0–99.8)
PLATELET COUNT, POC: 364 10*3/uL (ref 142–424)
POC Granulocyte: 1.9 — AB (ref 2–6.9)
POC LYMPH %: 55.5 % — AB (ref 10–50)
POC MID %: 3.7 %M (ref 0–12)
RBC: 4.39 M/uL (ref 4.04–5.48)
RDW, POC: 14.3 %
WBC: 4.6 10*3/uL (ref 4.6–10.2)

## 2018-02-03 LAB — GLUCOSE, POCT (MANUAL RESULT ENTRY): POC GLUCOSE: 278 mg/dL — AB (ref 70–99)

## 2018-02-03 MED ORDER — METFORMIN HCL 500 MG PO TABS: 500.0000 mg | ORAL_TABLET | Freq: Two times a day (BID) | ORAL | 1 refills | Status: DC

## 2018-02-03 MED ORDER — AMOXICILLIN-POT CLAVULANATE 875-125 MG PO TABS: 1.0000 | ORAL_TABLET | Freq: Two times a day (BID) | ORAL | 0 refills | Status: DC

## 2018-02-03 MED ORDER — IPRATROPIUM BROMIDE 0.03 % NA SOLN: 2.0000 | Freq: Two times a day (BID) | NASAL | 0 refills | Status: DC

## 2018-02-03 NOTE — Patient Instructions (Addendum)
We recommend that you schedule a mammogram for breast cancer screening. Typically, you do not need a referral to do this. Please contact a local imaging center to schedule your mammogram.  [facility] - [phone]  *ask for the Radiology Department [facility]) - [phone] or [phone]  [facility] - [phone] [facility] - [phone] [facility] - [phone]  *ask for the Radiology Department [facility] - [phone]  *ask for the Radiology Department [facility] - [phone]  *ask for the Mammography Department [facility] - [phone]    IF you received an x-ray today, you will receive an invoice from [facility]. Please contact [facility] at [phone] with questions or concerns regarding your invoice.   IF you received labwork today, you will receive an invoice from [facility]. Please contact [facility] at [phone] with questions or concerns regarding your invoice.   Our billing staff will not be able to assist you with questions regarding bills from these companies.  You will be contacted with the lab results as soon as they are available. The fastest way to get your results is to activate your My Chart account. Instructions are located on the last page of this paperwork. If you have not heard from us regarding the results in 2 weeks, please contact this office.     Diabetes Mellitus and Nutrition When you have diabetes (diabetes mellitus), it is very important to have healthy eating habits because your blood sugar (glucose) levels are greatly affected by what you eat and drink. Eating healthy foods in the appropriate amounts, at about the same times every day, can help you:  Control your blood glucose.  Lower your risk of heart disease.  Improve your blood pressure.  Reach or maintain a healthy weight.  Every person with  diabetes is different, and each person has different needs for a meal plan. Your health care provider may recommend that you work with a diet and nutrition specialist (dietitian) to make a meal plan that is best for you. Your meal plan may vary depending on factors such as:  The calories you need.  The medicines you take.  Your weight.  Your blood glucose, blood pressure, and cholesterol levels.  Your activity level.  Other health conditions you have, such as heart or kidney disease.  How do carbohydrates affect me? Carbohydrates affect your blood glucose level more than any other type of food. Eating carbohydrates naturally increases the amount of glucose in your blood. Carbohydrate counting is a method for keeping track of how many carbohydrates you eat. Counting carbohydrates is important to keep your blood glucose at a healthy level, especially if you use insulin or take certain oral diabetes medicines. It is important to know how many carbohydrates you can safely have in each meal. This is different for every person. Your dietitian can help you calculate how many carbohydrates you should have at each meal and for snack. Foods that contain carbohydrates include:  Bread, cereal, rice, pasta, and crackers.  Potatoes and corn.  Peas, beans, and lentils.  Milk and yogurt.  Fruit and juice.  Desserts, such as cakes, cookies, ice cream, and candy.  How does alcohol affect me? Alcohol can cause a sudden decrease in blood glucose (hypoglycemia), especially if you use insulin or take certain oral diabetes medicines. Hypoglycemia can be a life-threatening condition. Symptoms of hypoglycemia (sleepiness, dizziness, and confusion) are similar to symptoms   of having too much alcohol. If your health care provider says that alcohol is safe for you, follow these guidelines:  Limit alcohol intake to no more than 1 drink per day for nonpregnant women and 2 drinks per day for men. One drink equals  12 oz of beer, 5 oz of wine, or 1 oz of hard liquor.  Do not drink on an empty stomach.  Keep yourself hydrated with water, diet soda, or unsweetened iced tea.  Keep in mind that regular soda, juice, and other mixers may contain a lot of sugar and must be counted as carbohydrates.  What are tips for following this plan? Reading food labels  Start by checking the serving size on the label. The amount of calories, carbohydrates, fats, and other nutrients listed on the label are based on one serving of the food. Many foods contain more than one serving per package.  Check the total grams (g) of carbohydrates in one serving. You can calculate the number of servings of carbohydrates in one serving by dividing the total carbohydrates by 15. For example, if a food has 30 g of total carbohydrates, it would be equal to 2 servings of carbohydrates.  Check the number of grams (g) of saturated and trans fats in one serving. Choose foods that have low or no amount of these fats.  Check the number of milligrams (mg) of sodium in one serving. Most people should limit total sodium intake to less than 2,300 mg per day.  Always check the nutrition information of foods labeled as "low-fat" or "nonfat". These foods may be higher in added sugar or refined carbohydrates and should be avoided.  Talk to your dietitian to identify your daily goals for nutrients listed on the label. Shopping  Avoid buying canned, premade, or processed foods. These foods tend to be high in fat, sodium, and added sugar.  Shop around the outside edge of the grocery store. This includes fresh fruits and vegetables, bulk grains, fresh meats, and fresh dairy. Cooking  Use low-heat cooking methods, such as baking, instead of high-heat cooking methods like deep frying.  Cook using healthy oils, such as olive, canola, or sunflower oil.  Avoid cooking with butter, cream, or high-fat meats. Meal planning  Eat meals and snacks  regularly, preferably at the same times every day. Avoid going long periods of time without eating.  Eat foods high in fiber, such as fresh fruits, vegetables, beans, and whole grains. Talk to your dietitian about how many servings of carbohydrates you can eat at each meal.  Eat 4-6 ounces of lean protein each day, such as lean meat, chicken, fish, eggs, or tofu. 1 ounce is equal to 1 ounce of meat, chicken, or fish, 1 egg, or 1/4 cup of tofu.  Eat some foods each day that contain healthy fats, such as avocado, nuts, seeds, and fish. Lifestyle   Check your blood glucose regularly.  Exercise at least 30 minutes 5 or more days each week, or as told by your health care provider.  Take medicines as told by your health care provider.  Do not use any products that contain nicotine or tobacco, such as cigarettes and e-cigarettes. If you need help quitting, ask your health care provider.  Work with a counselor or diabetes educator to identify strategies to manage stress and any emotional and social challenges. What are some questions to ask my health care provider?  Do I need to meet with a diabetes educator?  Do I need to   meet with a dietitian?  What number can I call if I have questions?  When are the best times to check my blood glucose? Where to find more information:  American Diabetes Association: diabetes.org/food-and-fitness/food  Academy of Nutrition and Dietetics: www.eatright.org/resources/health/diseases-and-conditions/diabetes  National Institute of Diabetes and Digestive and Kidney Diseases (NIH): www.niddk.nih.gov/health-information/diabetes/overview/diet-eating-physical-activity Summary  A healthy meal plan will help you control your blood glucose and maintain a healthy lifestyle.  Working with a diet and nutrition specialist (dietitian) can help you make a meal plan that is best for you.  Keep in mind that carbohydrates and alcohol have immediate effects on your  blood glucose levels. It is important to count carbohydrates and to use alcohol carefully. This information is not intended to replace advice given to you by your health care provider. Make sure you discuss any questions you have with your health care provider. Document Released: 04/12/2005 Document Revised: 08/20/2016 Document Reviewed: 08/20/2016 Elsevier Interactive Patient Education  2018 Elsevier Inc.  

## 2018-02-03 NOTE — Telephone Encounter (Signed)
Pt. called to report sore throat, cough and intermittent chest discomfort over past 3 weeks.  Stated "my main concern is this cough that I can't get rid of."  Reported has had some mid-chest discomfort over past few months, that "seems to occur with eating."  Then, reported left-sided chest pressure that comes and goes, and "lasts only a few seconds", since she has had the cough; reported "it feels like a burning in the chest", as the cough is subsiding.  Denied shortness of breath, nausea, vomiting, or radiation of chest discomfort.  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  Reported persistent cough and difficulty with coughing up the mucus.  Reported sore throat that has been intermittent over the past 3 weeks. Also, reported intermittent sinus drainage, but denied any nasal congestion.  Unsure about fever, as she stated she has had intermittent "hot and cold episodes while in Menopause".  Has tried OTC Tylenol products, Dayquil and Nyquil.   Sched. appt. today at 3:20 PM with PCP.         Reason for Disposition . Cough has been present for > 3 weeks . Chest pain(s) lasting a few seconds from coughing AND [2] persists > 3 days  Answer Assessment - Initial Assessment Questions 1. ONSET: "When did the cough begin?"      About 3 weeks ago  2. SEVERITY: "How bad is the cough today?"      Persistent 3. RESPIRATORY DISTRESS: "Describe your breathing."      Denied shortness of breath  4. FEVER: "Do you have a fever?" If so, ask: "What is your temperature, how was it measured, and when did it start?"     Unsure; has hot and cold episodes since in Menopause  5. HEMOPTYSIS: "Are you coughing up any blood?" If so ask: "How much?" (flecks, streaks, tablespoons, etc.)     Denied  6. TREATMENT: "What have you done so far to treat the cough?" (e.g., meds,  fluids, humidifier)     Nyquil at night, and Tylenol Cold and Dayquil during day 7. CARDIAC HISTORY: "Do you have any history of heart disease?" (e.g., heart attack, congestive heart failure)     no 8. LUNG HISTORY: "Do you have any history of lung disease?"  (e.g., pulmonary embolus, asthma, emphysema)     Denied  9. PE RISK FACTORS: "Do you have a history of blood clots?" (or: recent major surgery, recent prolonged travel, bedridden)     No  10. OTHER SYMPTOMS: "Do you have any other symptoms? (e.g., runny nose, wheezing, chest pain)       Sore throat, left sided chest pressure, intermittent sinus drainage, coughing up some yellow mucus   11. PREGNANCY: "Is there any chance you are pregnant?" "When was your last menstrual period?"       In Menopause 12. TRAVEL: "Have you traveled out of the country in the last month?" (e.g., travel history, exposures)      no  Answer Assessment - Initial Assessment Questions 1. LOCATION: "Where does it hurt?"       Left chest discomfort x 2-3 weeks,  and middle of chest for a few months  2. RADIATION: "Does the pain go anywhere else?" (e.g., into neck, jaw, arms, back)     denied 3. ONSET: "When did the chest pain begin?" (Minutes, hours or days)  For awhile; for a few months  4. PATTERN "Does the pain come and go, or has it been constant since it started?"  "Does it get worse with exertion?"      It comes and goes after a few seconds 5. DURATION: "How long does it last" (e.g., seconds, minutes, hours)     Only a few seconds  6. SEVERITY: "How bad is the pain?"  (e.g., Scale 1-10; mild, moderate, or severe)    - MILD (1-3): doesn't interfere with normal activities     - MODERATE (4-7): interferes with normal activities or awakens from sleep    - SEVERE (8-10): excruciating pain, unable to do any normal activities       Denies at present  7. CARDIAC RISK FACTORS: "Do you have any history of heart problems or risk factors for heart disease?"  (e.g., prior heart attack, angina; high blood pressure, diabetes, being overweight, high cholesterol, smoking, or strong family history of heart disease)   Overweight; nondiabetic, denied HTN, denied hyperlipidemia, nonsmoker; denied family hx.    8. PULMONARY RISK FACTORS: "Do you have any history of lung disease?"  (e.g., blood clots in lung, asthma, emphysema, birth control pills)     Denied the above  9. CAUSE: "What do you think is causing the chest pain?"     Thinks it may be the cough that she has had for 3 weeks.  10. OTHER SYMPTOMS: "Do you have any other symptoms?" (e.g., dizziness, nausea, vomiting, sweating, fever, difficulty breathing, cough)       Denied dizziness, denied shortness of breath, nausea or vomiting. 11. PREGNANCY: "Is there any chance you are pregnant?" "When was your last menstrual period?"      In Menopause  Protocols used: COUGH - ACUTE NON-PRODUCTIVE-A-AH, CHEST PAIN-A-AH
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
## 2018-02-04 ENCOUNTER — Encounter: Payer: Self-pay | Admitting: Family Medicine

## 2018-02-04 LAB — COMPREHENSIVE METABOLIC PANEL
ALBUMIN: 4.4 g/dL (ref 3.5–5.5)
ALK PHOS: 76 IU/L (ref 39–117)
ALT: 29 IU/L (ref 0–32)
AST: 21 IU/L (ref 0–40)
Albumin/Globulin Ratio: 1.6 (ref 1.2–2.2)
BUN / CREAT RATIO: 11 (ref 9–23)
BUN: 7 mg/dL (ref 6–24)
Bilirubin Total: 0.3 mg/dL (ref 0.0–1.2)
CALCIUM: 9.2 mg/dL (ref 8.7–10.2)
CO2: 22 mmol/L (ref 20–29)
CREATININE: 0.64 mg/dL (ref 0.57–1.00)
Chloride: 96 mmol/L (ref 96–106)
GFR calc Af Amer: 114 mL/min/{1.73_m2} (ref >59)
GFR calc non Af Amer: 99 mL/min/{1.73_m2} (ref >59)
GLUCOSE: 277 mg/dL — AB (ref 65–99)
Globulin, Total: 2.8 g/dL (ref 1.5–4.5)
Potassium: 4 mmol/L (ref 3.5–5.2)
Sodium: 135 mmol/L (ref 134–144)
Total Protein: 7.2 g/dL (ref 6.0–8.5)

## 2018-02-04 LAB — MICROALBUMIN / CREATININE URINE RATIO
CREATININE, UR: 34 mg/dL
Microalbumin, Urine: <3 ug/mL

## 2018-02-04 LAB — LIPID PANEL
CHOL/HDL RATIO: 5 ratio — AB (ref 0.0–4.4)
CHOLESTEROL TOTAL: 238 mg/dL — AB (ref 100–199)
HDL: 48 mg/dL (ref >39)
LDL Calculated: 159 mg/dL — ABNORMAL HIGH (ref 0–99)
Triglycerides: 153 mg/dL — ABNORMAL HIGH (ref 0–149)
VLDL Cholesterol Cal: 31 mg/dL (ref 5–40)

## 2018-02-04 LAB — TSH: TSH: 0.669 u[IU]/mL (ref 0.450–4.500)

## 2018-03-02 ENCOUNTER — Other Ambulatory Visit: Payer: Self-pay | Admitting: Family Medicine

## 2018-03-03 NOTE — Telephone Encounter (Signed)
LOV 02/03/18 Dr. Nilda SimmerKristi Young Last refill 02/03/18  30 ml with 0 refill

## 2018-03-20 DIAGNOSIS — L608 Other nail disorders: Secondary | ICD-10-CM | POA: Diagnosis not present

## 2018-03-20 DIAGNOSIS — R0789 Other chest pain: Secondary | ICD-10-CM | POA: Diagnosis not present

## 2018-03-20 DIAGNOSIS — B3789 Other sites of candidiasis: Secondary | ICD-10-CM | POA: Diagnosis not present

## 2018-03-20 DIAGNOSIS — R1013 Epigastric pain: Secondary | ICD-10-CM | POA: Diagnosis not present

## 2018-03-20 DIAGNOSIS — Z131 Encounter for screening for diabetes mellitus: Secondary | ICD-10-CM | POA: Diagnosis not present

## 2018-03-20 DIAGNOSIS — E1165 Type 2 diabetes mellitus with hyperglycemia: Secondary | ICD-10-CM | POA: Diagnosis not present

## 2018-03-25 ENCOUNTER — Ambulatory Visit
Admission: RE | Admit: 2018-03-25 | Discharge: 2018-03-25 | Disposition: A | Discharge status: Home / Self Care | Payer: Federal, State, Local not specified - PPO | Source: Ambulatory Visit | Attending: Family Medicine | Admitting: Family Medicine

## 2018-03-25 DIAGNOSIS — Z1231 Encounter for screening mammogram for malignant neoplasm of breast: Secondary | ICD-10-CM | POA: Insufficient documentation

## 2018-03-25 DIAGNOSIS — Z1239 Encounter for other screening for malignant neoplasm of breast: Secondary | ICD-10-CM

## 2019-02-19 ENCOUNTER — Other Ambulatory Visit: Payer: Self-pay | Admitting: Family Medicine

## 2019-02-24 ENCOUNTER — Ambulatory Visit: Payer: Federal, State, Local not specified - PPO | Admitting: Family Medicine

## 2019-02-25 ENCOUNTER — Encounter: Payer: Self-pay | Admitting: Family Medicine

## 2019-03-23 ENCOUNTER — Encounter: Payer: Self-pay | Admitting: Family Medicine

## 2019-03-23 ENCOUNTER — Ambulatory Visit (INDEPENDENT_AMBULATORY_CARE_PROVIDER_SITE_OTHER): Payer: Federal, State, Local not specified - PPO | Admitting: Family Medicine

## 2019-03-23 ENCOUNTER — Other Ambulatory Visit: Payer: Self-pay

## 2019-03-23 DIAGNOSIS — M1990 Unspecified osteoarthritis, unspecified site: Secondary | ICD-10-CM | POA: Diagnosis not present

## 2019-03-23 DIAGNOSIS — Z7689 Persons encountering health services in other specified circumstances: Secondary | ICD-10-CM

## 2019-03-23 DIAGNOSIS — E1165 Type 2 diabetes mellitus with hyperglycemia: Secondary | ICD-10-CM | POA: Diagnosis not present

## 2019-03-23 DIAGNOSIS — E1169 Type 2 diabetes mellitus with other specified complication: Secondary | ICD-10-CM | POA: Insufficient documentation

## 2019-03-23 DIAGNOSIS — D649 Anemia, unspecified: Secondary | ICD-10-CM | POA: Insufficient documentation

## 2019-03-23 DIAGNOSIS — N644 Mastodynia: Secondary | ICD-10-CM

## 2019-03-23 DIAGNOSIS — E785 Hyperlipidemia, unspecified: Secondary | ICD-10-CM

## 2019-03-23 DIAGNOSIS — IMO0002 Reserved for concepts with insufficient information to code with codable children: Secondary | ICD-10-CM | POA: Insufficient documentation

## 2019-03-23 NOTE — Assessment & Plan Note (Signed)
Will check A1C at upcoming CPE. Intolerant to metformin. Congratulated good weight loss with healthy lifestyle changes

## 2019-03-23 NOTE — Assessment & Plan Note (Signed)
Stable and under good control, continue OTC pain relievers prn

## 2019-03-23 NOTE — Assessment & Plan Note (Signed)
Will recheck lipids at upcoming CPE. Continue good diet and exercise modifications

## 2019-03-23 NOTE — Progress Notes (Signed)
There were no vitals taken for this visit.   Subjective:    Patient ID: Suzanne Young, female    DOB: 07/02/1961, 58 y.o.   MRN: [id number]  HPI: Suzanne Young is a 58 y.o. female  Chief Complaint  Patient presents with  . Establish Care    . This visit was completed via WebEx due to the restrictions of the COVID-19 pandemic. All issues as above were discussed and addressed. Physical exam was done as above through visual confirmation on WebEx. If it was felt that the patient should be evaluated in the office, they were directed there. The patient verbally consented to this visit. . Location of the patient: home . Location of the provider: home . Those involved with this call:  . Provider: Roosvelt Maserachel Lane, PA-C . CMA: Tiffany Reel, CMA . Front Desk/Registration: Harriet PhoJoliza Johnson  . Time spent on call: 30 minutes with patient face to face via video conference. More than 50% of this time was spent in counseling and coordination of care. 10 minutes total spent in review of patient's record and preparation of their chart. I verified patient identity using two factors (patient name and date of birth). Patient consents verbally to being seen via telemedicine visit today.   Patient presenting today to establish care.   PMHx significant for uncontrolled DM 2, HLD, anemia, and OA.   DM - Started on the metformin last year, last A1C 9.3 01/2019. States it made her feel "funny in general" so she stopped it and has been working on diet and exercise modifications. Down 15 lb so far and feels like that is going well. Denies polyuria, polydipsia, polyphagia, low blood sugar spells.   HLD - diet controlled currently. No CP, SOB, claudication, myalgias.   Anemia - Currently on iron supplements. Asymptomatic.   Arthritis - controlled with prn OTC pain relievers.   Notes she's having some left breast pain and that the main reason she's seeking care is to get a breast exam and have a CPE.    Relevant past medical, surgical, family and social history reviewed and updated as indicated. Interim medical history since our last visit reviewed. Allergies and medications reviewed and updated.  Review of Systems  Per HPI unless specifically indicated above     Objective:    There were no vitals taken for this visit.  Wt Readings from Last 3 Encounters:  02/03/18 236 lb 12.8 oz (107.4 kg)  06/12/17 240 lb 9.6 oz (109.1 kg)  11/15/15 235 lb 11.2 oz (106.9 kg)    Physical Exam Vitals signs and nursing note reviewed.  Constitutional:      General: She is not in acute distress.    Appearance: Normal appearance.  HENT:     Head: Atraumatic.     Right Ear: External ear normal.     Left Ear: External ear normal.     Nose: Nose normal. No congestion.     Mouth/Throat:     Mouth: Mucous membranes are moist.     Pharynx: Oropharynx is clear. No posterior oropharyngeal erythema.  Eyes:     Extraocular Movements: Extraocular movements intact.     Conjunctiva/sclera: Conjunctivae normal.  Neck:     Musculoskeletal: Normal range of motion.  Cardiovascular:     Comments: Unable to assess via virtual visit Pulmonary:     Effort: Pulmonary effort is normal. No respiratory distress.  Musculoskeletal: Normal range of motion.  Skin:    General: Skin is dry.  Findings: No erythema.  Neurological:     Mental Status: She is alert and oriented to person, place, and time.  Psychiatric:        Mood and Affect: Mood normal.        Thought Content: Thought content normal.        Judgment: Judgment normal.     Results for orders placed or performed in visit on 02/03/18  Comprehensive metabolic panel  Result Value Ref Range   Glucose 277 (H) 65 - 99 mg/dL   BUN 7 6 - 24 mg/dL   Creatinine, Ser 1.610.64 0.57 - 1.00 mg/dL   GFR calc non Af Amer 99 >59 mL/min/1.73   GFR calc Af Amer 114 >59 mL/min/1.73   BUN/Creatinine Ratio 11 9 - 23   Sodium 135 134 - 144 mmol/L   Potassium 4.0 3.5  - 5.2 mmol/L   Chloride 96 96 - 106 mmol/L   CO2 22 20 - 29 mmol/L   Calcium 9.2 8.7 - 10.2 mg/dL   Total Protein 7.2 6.0 - 8.5 g/dL   Albumin 4.4 3.5 - 5.5 g/dL   Globulin, Total 2.8 1.5 - 4.5 g/dL   Albumin/Globulin Ratio 1.6 1.2 - 2.2   Bilirubin Total 0.3 0.0 - 1.2 mg/dL   Alkaline Phosphatase 76 39 - 117 IU/L   AST 21 0 - 40 IU/L   ALT 29 0 - 32 IU/L  TSH  Result Value Ref Range   TSH 0.669 0.450 - 4.500 uIU/mL  Microalbumin / creatinine urine ratio  Result Value Ref Range   Creatinine, Urine 34.0 Not Estab. mg/dL   Microalbumin, Urine <0.9<3.0 Not Estab. ug/mL   Microalb/Creat Ratio <8.8 0.0 - 30.0 mg/g creat  Lipid panel  Result Value Ref Range   Cholesterol, Total 238 (H) 100 - 199 mg/dL   Triglycerides 604153 (H) 0 - 149 mg/dL   HDL 48 >54>39 mg/dL   VLDL Cholesterol Cal 31 5 - 40 mg/dL   LDL Calculated 098159 (H) 0 - 99 mg/dL   Chol/HDL Ratio 5.0 (H) 0.0 - 4.4 ratio  POCT CBC  Result Value Ref Range   WBC 4.6 4.6 - 10.2 K/uL   Lymph, poc 2.6 0.6 - 3.4   POC LYMPH PERCENT 55.5 (A) 10 - 50 %L   MID (cbc) 0.2 0 - 0.9   POC MID % 3.7 0 - 12 %M   POC Granulocyte 1.9 (A) 2 - 6.9   Granulocyte percent 40.8 37 - 80 %G   RBC 4.39 4.04 - 5.48 M/uL   Hemoglobin 11.9 (A) 12.2 - 16.2 g/dL   HCT, POC 11.937.4 (A) 14.737.7 - 47.9 %   MCV 85.1 80 - 97 fL   MCH, POC 27.2 27 - 31.2 pg   MCHC 32.0 31.8 - 35.4 g/dL   RDW, POC 82.914.3 %   Platelet Count, POC 364 142 - 424 K/uL   MPV 7.2 0 - 99.8 fL  POCT glycosylated hemoglobin (Hb A1C)  Result Value Ref Range   Hemoglobin A1C 9.3 (A) 4.0 - 5.6 %   HbA1c POC (<> result, manual entry)  4.0 - 5.6 %   HbA1c, POC (prediabetic range)  5.7 - 6.4 %   HbA1c, POC (controlled diabetic range)  0.0 - 7.0 %  POCT glucose (manual entry)  Result Value Ref Range   POC Glucose 278 (A) 70 - 99 mg/dl  POCT urinalysis dipstick  Result Value Ref Range   Color, UA yellow yellow   Clarity, UA  clear clear   Glucose, UA =500 (A) negative mg/dL   Bilirubin, UA  negative negative   Ketones, POC UA negative negative mg/dL   Spec Grav, UA <=1.005 (A) 1.010 - 1.025   Blood, UA negative negative   pH, UA 5.5 5.0 - 8.0   Protein Ur, POC negative negative mg/dL   Urobilinogen, UA 0.2 0.2 or 1.0 E.U./dL   Nitrite, UA Negative Negative   Leukocytes, UA Negative Negative      Assessment & Plan:   Problem List Items Addressed This Visit      Endocrine   Uncontrolled diabetes mellitus ([place])    Will check A1C at upcoming CPE. Intolerant to metformin. Congratulated good weight loss with healthy lifestyle changes      Hyperlipidemia associated with type 2 diabetes mellitus ([place])    Will recheck lipids at upcoming CPE. Continue good diet and exercise modifications        Musculoskeletal and Integument   Arthritis    Stable and under good control, continue OTC pain relievers prn        Other   Anemia    Recheck labs at upcoming CPE. Continue current supplementation      Relevant Medications   Cyanocobalamin (VITAMIN B 12 PO)   Ferrous Gluconate (IRON 27 PO)    Other Visit Diagnoses    Encounter to establish care    -  Primary   Breast pain, left       Will perform exam and set up for imaging at upcoming CPE       Follow up plan: Return for CPE ASAP.

## 2019-03-23 NOTE — Assessment & Plan Note (Signed)
Recheck labs at upcoming CPE. Continue current supplementation

## 2019-04-20 ENCOUNTER — Encounter: Payer: Self-pay | Admitting: Family Medicine

## 2019-04-20 ENCOUNTER — Ambulatory Visit (INDEPENDENT_AMBULATORY_CARE_PROVIDER_SITE_OTHER): Payer: Federal, State, Local not specified - PPO | Admitting: Family Medicine

## 2019-04-20 ENCOUNTER — Other Ambulatory Visit: Payer: Self-pay

## 2019-04-20 VITALS — BP 130/84 | HR 74 | Temp 98.8°F | Ht 64.5 in | Wt 223.0 lb

## 2019-04-20 DIAGNOSIS — N644 Mastodynia: Secondary | ICD-10-CM | POA: Diagnosis not present

## 2019-04-20 DIAGNOSIS — E1169 Type 2 diabetes mellitus with other specified complication: Secondary | ICD-10-CM

## 2019-04-20 DIAGNOSIS — E785 Hyperlipidemia, unspecified: Secondary | ICD-10-CM

## 2019-04-20 DIAGNOSIS — E1165 Type 2 diabetes mellitus with hyperglycemia: Secondary | ICD-10-CM | POA: Diagnosis not present

## 2019-04-20 DIAGNOSIS — Z0000 Encounter for general adult medical examination without abnormal findings: Secondary | ICD-10-CM

## 2019-04-20 DIAGNOSIS — D649 Anemia, unspecified: Secondary | ICD-10-CM

## 2019-04-20 LAB — UA/M W/RFLX CULTURE, ROUTINE
Bilirubin, UA: NEGATIVE
Glucose, UA: NEGATIVE
Ketones, UA: NEGATIVE
Leukocytes,UA: NEGATIVE
Nitrite, UA: NEGATIVE
Protein,UA: NEGATIVE
RBC, UA: NEGATIVE
Specific Gravity, UA: 1.01 (ref 1.005–1.030)
Urobilinogen, Ur: 0.2 mg/dL (ref 0.2–1.0)
pH, UA: 5 (ref 5.0–7.5)
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
## 2019-04-21 ENCOUNTER — Telehealth: Payer: Self-pay | Admitting: Family Medicine

## 2019-04-21 DIAGNOSIS — Z1239 Encounter for other screening for malignant neoplasm of breast: Secondary | ICD-10-CM

## 2019-04-21 LAB — CBC WITH DIFFERENTIAL/PLATELET
Basophils Absolute: 0 10*3/uL (ref 0.0–0.2)
Basos: 1 %
EOS (ABSOLUTE): 0 10*3/uL (ref 0.0–0.4)
Eos: 1 %
Hematocrit: 40.2 % (ref 34.0–46.6)
Hemoglobin: 13.2 g/dL (ref 11.1–15.9)
Immature Grans (Abs): 0 10*3/uL (ref 0.0–0.1)
Immature Granulocytes: 0 %
Lymphocytes Absolute: 1.9 10*3/uL (ref 0.7–3.1)
Lymphs: 48 %
MCH: 28.4 pg (ref 26.6–33.0)
MCHC: 32.8 g/dL (ref 31.5–35.7)
MCV: 87 fL (ref 79–97)
Monocytes Absolute: 0.4 10*3/uL (ref 0.1–0.9)
Monocytes: 10 %
Neutrophils Absolute: 1.5 10*3/uL (ref 1.4–7.0)
Neutrophils: 40 %
Platelets: 323 10*3/uL (ref 150–450)
RBC: 4.65 x10E6/uL (ref 3.77–5.28)
RDW: 13.4 % (ref 11.7–15.4)
WBC: 3.8 10*3/uL (ref 3.4–10.8)

## 2019-04-21 LAB — LIPID PANEL W/O CHOL/HDL RATIO
Cholesterol, Total: 223 mg/dL — ABNORMAL HIGH (ref 100–199)
HDL: 50 mg/dL (ref >39)
LDL Chol Calc (NIH): 150 mg/dL — ABNORMAL HIGH (ref 0–99)
Triglycerides: 129 mg/dL (ref 0–149)
VLDL Cholesterol Cal: 23 mg/dL (ref 5–40)

## 2019-04-21 LAB — COMPREHENSIVE METABOLIC PANEL
ALT: 23 IU/L (ref 0–32)
AST: 17 IU/L (ref 0–40)
Albumin/Globulin Ratio: 1.6 (ref 1.2–2.2)
Albumin: 4.4 g/dL (ref 3.8–4.9)
Alkaline Phosphatase: 74 IU/L (ref 39–117)
BUN/Creatinine Ratio: 16 (ref 9–23)
BUN: 11 mg/dL (ref 6–24)
Bilirubin Total: 0.3 mg/dL (ref 0.0–1.2)
CO2: 24 mmol/L (ref 20–29)
Calcium: 9.6 mg/dL (ref 8.7–10.2)
Chloride: 99 mmol/L (ref 96–106)
Creatinine, Ser: 0.69 mg/dL (ref 0.57–1.00)
GFR calc Af Amer: 110 mL/min/{1.73_m2} (ref >59)
GFR calc non Af Amer: 96 mL/min/{1.73_m2} (ref >59)
Globulin, Total: 2.8 g/dL (ref 1.5–4.5)
Glucose: 232 mg/dL — ABNORMAL HIGH (ref 65–99)
Potassium: 4.3 mmol/L (ref 3.5–5.2)
Sodium: 137 mmol/L (ref 134–144)
Total Protein: 7.2 g/dL (ref 6.0–8.5)

## 2019-04-21 LAB — TSH: TSH: 0.97 u[IU]/mL (ref 0.450–4.500)

## 2019-04-21 LAB — HEMOGLOBIN A1C
Est. average glucose Bld gHb Est-mCnc: 255 mg/dL
Hgb A1c MFr Bld: 10.5 % — ABNORMAL HIGH (ref 4.8–5.6)

## 2019-04-21 NOTE — Telephone Encounter (Signed)
Because she had a breast complaint they will want the diagnostic imaging. I will place screening mammogram orders in case her symptoms resolve.   Copied from [place] [id number]. Topic: General - Other >> Apr 20, 2019  4:22 PM Pauline Good wrote: Reason for CRM: pt stated the way the order is written for her mammogram her insurance might not pay. Please call pt to advise

## 2019-04-21 NOTE — Telephone Encounter (Signed)
Can someone speak with patient to clarify if she's ok getting diagnostic done? This morning there was a message with patient concern over cost so I put in screening (particularly since her pain seems muscular and is resolving). Will change diagnostic order if she's ok with doing that image but it would be great to clarify  Copied from [place] [id number]. Topic: Referral - Question >> Apr 21, 2019 10:38 AM Parke Poisson wrote: Reason for CRM: [facility] ask that you change order for mammogram to Diagnostic bilateral mammogram TOMO [id number]

## 2019-04-21 NOTE — Telephone Encounter (Signed)
Called and left patient a VM asking for her to please return my call to discuss.

## 2019-04-22 ENCOUNTER — Telehealth: Payer: Self-pay | Admitting: Family Medicine

## 2019-04-22 NOTE — Telephone Encounter (Signed)
Sounds good, all orders should be in

## 2019-04-22 NOTE — Telephone Encounter (Signed)
Called and spoke to patient. Explained to her what was going on. She states she wants both orders entered and then [facility] can decide which one they need.

## 2019-04-22 NOTE — Telephone Encounter (Signed)
Called pt, discussed elevated A1C and chol results and my recommendation to restart medications. Did not tolerate metformin in the past, discussed GLP 1 vs oral agent and pt states she would like to pray about the decision and let us know later. Declines chol medication, discussed some supplements and diet and exercise changes. Recheck 3 months.

## 2019-04-24 NOTE — Assessment & Plan Note (Signed)
Currently diet controlled, not very comfortable starting an rx for this. Will recheck lipids and discuss based on results. Continue working on good lifestyle modifications.

## 2019-04-24 NOTE — Assessment & Plan Note (Signed)
Has not had labs in over a year, will recheck A1C now that she's being diligent with diet and exercise. She is hesitant to restart medication as the metformin caused some side effects. Will discuss based on A1C results. Continue working on lifestyle habits

## 2019-04-24 NOTE — Assessment & Plan Note (Signed)
Will check CBC and make adjustments if needed

## 2019-04-30 ENCOUNTER — Ambulatory Visit
Admission: RE | Admit: 2019-04-30 | Discharge: 2019-04-30 | Disposition: A | Discharge status: Home / Self Care | Payer: Federal, State, Local not specified - PPO | Source: Ambulatory Visit | Attending: Family Medicine | Admitting: Family Medicine

## 2019-04-30 DIAGNOSIS — Z1231 Encounter for screening mammogram for malignant neoplasm of breast: Secondary | ICD-10-CM | POA: Diagnosis not present

## 2019-04-30 DIAGNOSIS — Z1239 Encounter for other screening for malignant neoplasm of breast: Secondary | ICD-10-CM | POA: Diagnosis not present

## 2019-10-22 ENCOUNTER — Ambulatory Visit: Payer: Federal, State, Local not specified - PPO | Admitting: Family Medicine

## 2020-05-02 DIAGNOSIS — N952 Postmenopausal atrophic vaginitis: Secondary | ICD-10-CM | POA: Diagnosis not present

## 2020-05-02 DIAGNOSIS — K219 Gastro-esophageal reflux disease without esophagitis: Secondary | ICD-10-CM | POA: Diagnosis not present

## 2020-05-02 DIAGNOSIS — R079 Chest pain, unspecified: Secondary | ICD-10-CM | POA: Diagnosis not present

## 2020-05-02 DIAGNOSIS — E119 Type 2 diabetes mellitus without complications: Secondary | ICD-10-CM | POA: Diagnosis not present

## 2020-05-02 DIAGNOSIS — E1165 Type 2 diabetes mellitus with hyperglycemia: Secondary | ICD-10-CM | POA: Diagnosis not present

## 2020-06-20 ENCOUNTER — Other Ambulatory Visit: Payer: Self-pay | Admitting: Family Medicine

## 2020-06-20 DIAGNOSIS — Z1231 Encounter for screening mammogram for malignant neoplasm of breast: Secondary | ICD-10-CM

## 2020-07-25 DIAGNOSIS — Z20822 Contact with and (suspected) exposure to covid-19: Secondary | ICD-10-CM | POA: Diagnosis not present

## 2020-07-25 DIAGNOSIS — R6889 Other general symptoms and signs: Secondary | ICD-10-CM | POA: Diagnosis not present

## 2020-08-16 ENCOUNTER — Ambulatory Visit
Admission: RE | Admit: 2020-08-16 | Discharge: 2020-08-16 | Disposition: A | Discharge status: Home / Self Care | Payer: Federal, State, Local not specified - PPO | Source: Ambulatory Visit | Attending: Family Medicine | Admitting: Family Medicine

## 2020-08-16 ENCOUNTER — Other Ambulatory Visit: Payer: Self-pay

## 2020-08-16 DIAGNOSIS — Z1231 Encounter for screening mammogram for malignant neoplasm of breast: Secondary | ICD-10-CM | POA: Insufficient documentation

## 2021-05-02 ENCOUNTER — Ambulatory Visit: Payer: Federal, State, Local not specified - PPO | Admitting: Physical Therapy

## 2021-05-09 ENCOUNTER — Encounter: Payer: Federal, State, Local not specified - PPO | Admitting: Physical Therapy

## 2021-05-09 ENCOUNTER — Ambulatory Visit: Attending: Family Medicine | Admitting: Physical Therapy

## 2021-05-09 ENCOUNTER — Encounter: Payer: Self-pay | Admitting: Physical Therapy

## 2021-05-09 DIAGNOSIS — R519 Headache, unspecified: Secondary | ICD-10-CM | POA: Diagnosis present

## 2021-05-09 DIAGNOSIS — R2681 Unsteadiness on feet: Secondary | ICD-10-CM | POA: Diagnosis present

## 2021-05-09 DIAGNOSIS — M545 Low back pain, unspecified: Secondary | ICD-10-CM | POA: Insufficient documentation

## 2021-05-09 DIAGNOSIS — M5412 Radiculopathy, cervical region: Secondary | ICD-10-CM | POA: Diagnosis present

## 2021-05-09 DIAGNOSIS — M542 Cervicalgia: Secondary | ICD-10-CM | POA: Insufficient documentation

## 2021-05-09 DIAGNOSIS — M62838 Other muscle spasm: Secondary | ICD-10-CM | POA: Insufficient documentation

## 2021-05-09 DIAGNOSIS — R262 Difficulty in walking, not elsewhere classified: Secondary | ICD-10-CM | POA: Diagnosis present

## 2021-05-09 NOTE — Addendum Note (Signed)
Addended by: Norton Blizzard R on: 05/09/2021 01:37 PM   Modules accepted: Orders
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
## 2021-05-16 ENCOUNTER — Encounter: Payer: Self-pay | Admitting: Physical Therapy

## 2021-05-16 ENCOUNTER — Ambulatory Visit: Admitting: Physical Therapy

## 2021-05-16 DIAGNOSIS — R2681 Unsteadiness on feet: Secondary | ICD-10-CM

## 2021-05-16 DIAGNOSIS — M542 Cervicalgia: Secondary | ICD-10-CM

## 2021-05-16 DIAGNOSIS — M5412 Radiculopathy, cervical region: Secondary | ICD-10-CM

## 2021-05-16 DIAGNOSIS — M545 Low back pain, unspecified: Secondary | ICD-10-CM

## 2021-05-16 DIAGNOSIS — M62838 Other muscle spasm: Secondary | ICD-10-CM

## 2021-05-16 DIAGNOSIS — R262 Difficulty in walking, not elsewhere classified: Secondary | ICD-10-CM

## 2021-05-16 DIAGNOSIS — R519 Headache, unspecified: Secondary | ICD-10-CM
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
## 2021-05-23 ENCOUNTER — Ambulatory Visit: Admitting: Physical Therapy

## 2021-05-23 ENCOUNTER — Encounter: Payer: Self-pay | Admitting: Physical Therapy

## 2021-05-23 DIAGNOSIS — M542 Cervicalgia: Secondary | ICD-10-CM | POA: Diagnosis not present

## 2021-05-23 DIAGNOSIS — M5412 Radiculopathy, cervical region: Secondary | ICD-10-CM

## 2021-05-23 DIAGNOSIS — R519 Headache, unspecified: Secondary | ICD-10-CM
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
## 2021-05-30 ENCOUNTER — Ambulatory Visit: Admitting: Physical Therapy

## 2021-06-06 ENCOUNTER — Ambulatory Visit: Attending: Family Medicine | Admitting: Physical Therapy

## 2021-06-06 DIAGNOSIS — M542 Cervicalgia: Secondary | ICD-10-CM | POA: Diagnosis not present

## 2021-06-06 DIAGNOSIS — M545 Low back pain, unspecified: Secondary | ICD-10-CM

## 2021-06-06 DIAGNOSIS — R519 Headache, unspecified: Secondary | ICD-10-CM

## 2021-06-06 DIAGNOSIS — R2681 Unsteadiness on feet: Secondary | ICD-10-CM | POA: Diagnosis present

## 2021-06-06 DIAGNOSIS — M5412 Radiculopathy, cervical region: Secondary | ICD-10-CM | POA: Diagnosis present

## 2021-06-06 DIAGNOSIS — M62838 Other muscle spasm: Secondary | ICD-10-CM

## 2021-06-06 DIAGNOSIS — R262 Difficulty in walking, not elsewhere classified: Secondary | ICD-10-CM
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
## 2021-06-13 ENCOUNTER — Encounter: Payer: Self-pay | Admitting: Physical Therapy

## 2021-06-13 ENCOUNTER — Ambulatory Visit: Admitting: Physical Therapy

## 2021-06-13 VITALS — BP 178/88 | HR 76 | Temp 99.3°F

## 2021-06-13 DIAGNOSIS — R262 Difficulty in walking, not elsewhere classified: Secondary | ICD-10-CM

## 2021-06-13 DIAGNOSIS — R2681 Unsteadiness on feet: Secondary | ICD-10-CM

## 2021-06-13 DIAGNOSIS — M545 Low back pain, unspecified: Secondary | ICD-10-CM

## 2021-06-13 DIAGNOSIS — M542 Cervicalgia: Secondary | ICD-10-CM

## 2021-06-13 DIAGNOSIS — M5412 Radiculopathy, cervical region: Secondary | ICD-10-CM

## 2021-06-13 DIAGNOSIS — R519 Headache, unspecified: Secondary | ICD-10-CM

## 2021-06-13 DIAGNOSIS — M62838 Other muscle spasm: Secondary | ICD-10-CM
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
## 2021-06-20 ENCOUNTER — Encounter: Payer: Self-pay | Admitting: Physical Therapy

## 2021-06-20 ENCOUNTER — Ambulatory Visit: Admitting: Physical Therapy

## 2021-06-20 DIAGNOSIS — M542 Cervicalgia: Secondary | ICD-10-CM

## 2021-06-20 DIAGNOSIS — M5412 Radiculopathy, cervical region: Secondary | ICD-10-CM

## 2021-06-20 DIAGNOSIS — R262 Difficulty in walking, not elsewhere classified: Secondary | ICD-10-CM

## 2021-06-20 DIAGNOSIS — R519 Headache, unspecified: Secondary | ICD-10-CM

## 2021-06-20 DIAGNOSIS — M62838 Other muscle spasm: Secondary | ICD-10-CM

## 2021-06-20 DIAGNOSIS — M545 Low back pain, unspecified: Secondary | ICD-10-CM

## 2021-06-20 DIAGNOSIS — R2681 Unsteadiness on feet: Secondary | ICD-10-CM
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
## 2021-06-27 ENCOUNTER — Encounter: Payer: Self-pay | Admitting: Physical Therapy

## 2021-06-27 ENCOUNTER — Other Ambulatory Visit: Payer: Self-pay

## 2021-06-27 ENCOUNTER — Ambulatory Visit: Admitting: Physical Therapy

## 2021-06-27 DIAGNOSIS — R2681 Unsteadiness on feet: Secondary | ICD-10-CM

## 2021-06-27 DIAGNOSIS — R519 Headache, unspecified: Secondary | ICD-10-CM

## 2021-06-27 DIAGNOSIS — R262 Difficulty in walking, not elsewhere classified: Secondary | ICD-10-CM

## 2021-06-27 DIAGNOSIS — M62838 Other muscle spasm: Secondary | ICD-10-CM

## 2021-06-27 DIAGNOSIS — M542 Cervicalgia: Secondary | ICD-10-CM

## 2021-06-27 DIAGNOSIS — M545 Low back pain, unspecified: Secondary | ICD-10-CM

## 2021-06-27 DIAGNOSIS — M5412 Radiculopathy, cervical region: Secondary | ICD-10-CM
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
## 2021-07-03 ENCOUNTER — Other Ambulatory Visit: Payer: Self-pay | Admitting: Family Medicine

## 2021-07-03 DIAGNOSIS — Z1231 Encounter for screening mammogram for malignant neoplasm of breast: Secondary | ICD-10-CM

## 2021-07-04 ENCOUNTER — Ambulatory Visit: Admitting: Physical Therapy

## 2021-07-11 ENCOUNTER — Encounter: Payer: Self-pay | Admitting: Physical Therapy

## 2021-07-11 ENCOUNTER — Ambulatory Visit: Attending: Family Medicine | Admitting: Physical Therapy

## 2021-07-11 DIAGNOSIS — R519 Headache, unspecified: Secondary | ICD-10-CM

## 2021-07-11 DIAGNOSIS — R262 Difficulty in walking, not elsewhere classified: Secondary | ICD-10-CM

## 2021-07-11 DIAGNOSIS — R2681 Unsteadiness on feet: Secondary | ICD-10-CM

## 2021-07-11 DIAGNOSIS — M62838 Other muscle spasm: Secondary | ICD-10-CM

## 2021-07-11 DIAGNOSIS — M545 Low back pain, unspecified: Secondary | ICD-10-CM

## 2021-07-11 DIAGNOSIS — M5412 Radiculopathy, cervical region: Secondary | ICD-10-CM

## 2021-07-11 DIAGNOSIS — M542 Cervicalgia: Secondary | ICD-10-CM
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
## 2021-07-18 ENCOUNTER — Ambulatory Visit: Admitting: Physical Therapy

## 2021-07-18 ENCOUNTER — Encounter: Payer: Self-pay | Admitting: Physical Therapy

## 2021-07-18 ENCOUNTER — Other Ambulatory Visit: Payer: Self-pay | Admitting: Neurosurgery

## 2021-07-18 DIAGNOSIS — M545 Low back pain, unspecified: Secondary | ICD-10-CM

## 2021-07-18 DIAGNOSIS — M5412 Radiculopathy, cervical region: Secondary | ICD-10-CM

## 2021-07-18 DIAGNOSIS — R519 Headache, unspecified: Secondary | ICD-10-CM

## 2021-07-18 DIAGNOSIS — R262 Difficulty in walking, not elsewhere classified: Secondary | ICD-10-CM

## 2021-07-18 DIAGNOSIS — M5416 Radiculopathy, lumbar region: Secondary | ICD-10-CM

## 2021-07-18 DIAGNOSIS — M62838 Other muscle spasm: Secondary | ICD-10-CM

## 2021-07-18 DIAGNOSIS — M542 Cervicalgia: Secondary | ICD-10-CM | POA: Diagnosis not present

## 2021-07-18 DIAGNOSIS — R2681 Unsteadiness on feet: Secondary | ICD-10-CM
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
## 2021-07-25 ENCOUNTER — Encounter: Payer: Self-pay | Admitting: Physical Therapy

## 2021-07-25 ENCOUNTER — Ambulatory Visit: Admitting: Physical Therapy

## 2021-07-25 DIAGNOSIS — M542 Cervicalgia: Secondary | ICD-10-CM | POA: Diagnosis not present

## 2021-07-25 DIAGNOSIS — M545 Low back pain, unspecified: Secondary | ICD-10-CM

## 2021-07-25 DIAGNOSIS — M62838 Other muscle spasm: Secondary | ICD-10-CM

## 2021-07-25 DIAGNOSIS — R262 Difficulty in walking, not elsewhere classified: Secondary | ICD-10-CM

## 2021-07-25 DIAGNOSIS — R2681 Unsteadiness on feet: Secondary | ICD-10-CM

## 2021-07-25 DIAGNOSIS — M5412 Radiculopathy, cervical region: Secondary | ICD-10-CM

## 2021-07-25 DIAGNOSIS — R519 Headache, unspecified: Secondary | ICD-10-CM
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
## 2021-07-28 ENCOUNTER — Other Ambulatory Visit: Payer: Self-pay | Admitting: Neurosurgery

## 2021-07-28 DIAGNOSIS — M5416 Radiculopathy, lumbar region: Secondary | ICD-10-CM

## 2021-08-01 ENCOUNTER — Encounter: Payer: Self-pay | Admitting: Physical Therapy

## 2021-08-01 ENCOUNTER — Ambulatory Visit: Attending: Family Medicine | Admitting: Physical Therapy

## 2021-08-01 DIAGNOSIS — M545 Low back pain, unspecified: Secondary | ICD-10-CM | POA: Diagnosis present

## 2021-08-01 DIAGNOSIS — R262 Difficulty in walking, not elsewhere classified: Secondary | ICD-10-CM | POA: Insufficient documentation

## 2021-08-01 DIAGNOSIS — M542 Cervicalgia: Secondary | ICD-10-CM | POA: Diagnosis present

## 2021-08-01 DIAGNOSIS — R519 Headache, unspecified: Secondary | ICD-10-CM | POA: Insufficient documentation

## 2021-08-01 DIAGNOSIS — M62838 Other muscle spasm: Secondary | ICD-10-CM | POA: Diagnosis present

## 2021-08-01 DIAGNOSIS — R2681 Unsteadiness on feet: Secondary | ICD-10-CM | POA: Diagnosis present

## 2021-08-01 DIAGNOSIS — M5412 Radiculopathy, cervical region: Secondary | ICD-10-CM | POA: Diagnosis present
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
## 2021-08-03 ENCOUNTER — Encounter: Payer: Federal, State, Local not specified - PPO | Admitting: Physical Therapy

## 2021-08-03 ENCOUNTER — Ambulatory Visit
Admission: RE | Admit: 2021-08-03 | Discharge: 2021-08-03 | Disposition: A | Discharge status: Home / Self Care | Source: Ambulatory Visit | Attending: Neurosurgery | Admitting: Neurosurgery

## 2021-08-03 ENCOUNTER — Ambulatory Visit
Admission: RE | Admit: 2021-08-03 | Discharge: 2021-08-03 | Disposition: A | Discharge status: Home / Self Care | Payer: Worker's Compensation | Source: Ambulatory Visit | Attending: Neurosurgery | Admitting: Neurosurgery

## 2021-08-03 DIAGNOSIS — M5416 Radiculopathy, lumbar region: Secondary | ICD-10-CM

## 2021-08-08 ENCOUNTER — Ambulatory Visit: Admitting: Physical Therapy

## 2021-08-10 ENCOUNTER — Encounter: Payer: Federal, State, Local not specified - PPO | Admitting: Physical Therapy

## 2021-08-15 ENCOUNTER — Telehealth: Payer: Self-pay | Admitting: Physical Therapy

## 2021-08-15 ENCOUNTER — Ambulatory Visit: Admitting: Physical Therapy

## 2021-08-15 NOTE — Telephone Encounter (Signed)
Called patient when she did not come to her appointment scheduled at 1pm today. Left VM notifying patient of missed visit and requesting a call back to make sure she is aware of and agrees to upcoming scheduled appointments.   Everlean Alstrom. Graylon Good, PT, DPT 08/15/21, 3:55 PM

## 2021-08-17 ENCOUNTER — Encounter: Payer: Federal, State, Local not specified - PPO | Admitting: Physical Therapy

## 2021-08-21 ENCOUNTER — Encounter: Payer: Federal, State, Local not specified - PPO | Admitting: Physical Therapy

## 2021-08-22 ENCOUNTER — Ambulatory Visit
Admission: RE | Admit: 2021-08-22 | Discharge: 2021-08-22 | Disposition: A | Discharge status: Home / Self Care | Payer: Federal, State, Local not specified - PPO | Source: Ambulatory Visit | Attending: Family Medicine | Admitting: Family Medicine

## 2021-08-22 ENCOUNTER — Other Ambulatory Visit: Payer: Self-pay

## 2021-08-22 DIAGNOSIS — Z1231 Encounter for screening mammogram for malignant neoplasm of breast: Secondary | ICD-10-CM | POA: Insufficient documentation

## 2021-08-23 ENCOUNTER — Encounter: Payer: Federal, State, Local not specified - PPO | Admitting: Physical Therapy

## 2021-08-29 ENCOUNTER — Encounter: Payer: Federal, State, Local not specified - PPO | Admitting: Physical Therapy

## 2021-12-04 ENCOUNTER — Telehealth: Payer: Self-pay | Admitting: Physical Therapy

## 2021-12-04 NOTE — Telephone Encounter (Signed)
Called patient back to clarify request after she left VM asking [place] to for help with getting more visits approved. Patient answered and said her worker's comp is waiting for [place] to upload more information to the portal before they can approve more visits.  Spoke to [employer]. at our front office who usually handles work comp and transferred patient to Kendal Hymen to work on it with patient.  ? ?Suzanne Young. [name], PT, DPT ?12/04/21, 10:15 AM ? ?[facility] ?[address] ?[place], [place] [address] ?P: [phone] I F: [phone] ? ? ?

## 2021-12-05 ENCOUNTER — Telehealth: Payer: Self-pay | Admitting: Physical Therapy

## 2021-12-05 NOTE — Telephone Encounter (Signed)
Spoke to Clydie Braun, [place], to request updated PT referral for same condition as before since the last one was from September 2022 and it has been several months since patient has been seen. Clydie Braun agreed to fax the one from March to [place].  ? ?Luretha Murphy. [name], PT, DPT ?12/05/21, 9:19 AM ? ?[facility] ?[address] ?[place], [place] [address] ?P: [phone] I F: [phone] ? ?

## 2021-12-07 ENCOUNTER — Telehealth: Payer: Self-pay | Admitting: Physical Therapy

## 2021-12-07 NOTE — Telephone Encounter (Signed)
Called patient and gave her an update on our process figuring out what we need to do to help her get scheduled for more visits under work comp. Let her know the information I have is that the DOL paid for her past visits but I have not yet learned what we need to do to get her scheduled for more work comp visits but we are working on it. She said she had been working on getting a new referral from the doctor (I let her know we received one from February that we think will work) and she had misplaced a number from someone who handles claims for the DOL? And she can find it with a little digging. I let her know we would call back and ask for that number if needed.  ? ?Suzanne Young. [name], PT, DPT ?12/07/21, 3:06 PM ? ?[facility] ?[address] ?[place], [place] [address] ?P: [phone] I F: [phone] ? ?

## 2022-03-14 ENCOUNTER — Ambulatory Visit: Payer: Federal, State, Local not specified - PPO | Admitting: Dermatology

## 2022-05-01 ENCOUNTER — Telehealth: Payer: Self-pay

## 2022-05-01 NOTE — Telephone Encounter (Signed)
I recommend that she see me in clinic first since it's been so long.

## 2022-05-01 NOTE — Telephone Encounter (Signed)
Thoughts?

## 2022-05-01 NOTE — Telephone Encounter (Signed)
This is workman's comp so she will have her case manager send over approval.

## 2022-05-01 NOTE — Telephone Encounter (Signed)
-----   Message from Peggyann Shoals sent at 05/01/2022  8:52 AM EDT ----- Regarding: new referral Contact: [phone] She has been going to PT at [facility]. It's not helping so she was discharged and told to call  Danielle's office to request a referral for injections. She has not seen Danielle since 07/04/2021. Can she get a referral or will she need to be seen in the office?

## 2022-05-31 ENCOUNTER — Encounter: Payer: Self-pay | Admitting: Cardiology

## 2022-05-31 ENCOUNTER — Observation Stay
Admission: RE | Admit: 2022-05-31 | Discharge: 2022-06-01 | Disposition: A | Discharge status: Home / Self Care | Payer: Federal, State, Local not specified - PPO | Attending: Cardiology | Admitting: Cardiology

## 2022-05-31 ENCOUNTER — Encounter
Admission: RE | Disposition: A | Discharge status: Home / Self Care | Payer: Self-pay | Source: Home / Self Care | Attending: Cardiology

## 2022-05-31 ENCOUNTER — Other Ambulatory Visit: Payer: Self-pay

## 2022-05-31 DIAGNOSIS — R9431 Abnormal electrocardiogram [ECG] [EKG]: Secondary | ICD-10-CM | POA: Insufficient documentation

## 2022-05-31 DIAGNOSIS — E119 Type 2 diabetes mellitus without complications: Secondary | ICD-10-CM | POA: Insufficient documentation

## 2022-05-31 DIAGNOSIS — Z87891 Personal history of nicotine dependence: Secondary | ICD-10-CM | POA: Diagnosis not present

## 2022-05-31 DIAGNOSIS — E782 Mixed hyperlipidemia: Secondary | ICD-10-CM | POA: Insufficient documentation

## 2022-05-31 DIAGNOSIS — I2510 Atherosclerotic heart disease of native coronary artery without angina pectoris: Principal | ICD-10-CM | POA: Diagnosis present

## 2022-05-31 DIAGNOSIS — R9430 Abnormal result of cardiovascular function study, unspecified: Secondary | ICD-10-CM

## 2022-05-31 DIAGNOSIS — R079 Chest pain, unspecified: Secondary | ICD-10-CM

## 2022-05-31 DIAGNOSIS — Z01818 Encounter for other preprocedural examination: Secondary | ICD-10-CM

## 2022-05-31 DIAGNOSIS — Z79899 Other long term (current) drug therapy: Secondary | ICD-10-CM | POA: Insufficient documentation

## 2022-05-31 DIAGNOSIS — Z7984 Long term (current) use of oral hypoglycemic drugs: Secondary | ICD-10-CM | POA: Diagnosis not present

## 2022-05-31 HISTORY — PX: LEFT HEART CATH AND CORONARY ANGIOGRAPHY: CATH118249

## 2022-05-31 HISTORY — PX: CORONARY STENT INTERVENTION: CATH118234

## 2022-05-31 LAB — GLUCOSE, CAPILLARY
Glucose-Capillary: 234 mg/dL — ABNORMAL HIGH (ref 70–99)
Glucose-Capillary: 177 mg/dL — ABNORMAL HIGH (ref 70–99)
Glucose-Capillary: 164 mg/dL — ABNORMAL HIGH (ref 70–99)
Glucose-Capillary: 131 mg/dL — ABNORMAL HIGH (ref 70–99)

## 2022-05-31 LAB — POCT ACTIVATED CLOTTING TIME
Activated Clotting Time: 311 seconds
Activated Clotting Time: 311 seconds

## 2022-05-31 SURGERY — LEFT HEART CATH AND CORONARY ANGIOGRAPHY
Anesthesia: Moderate Sedation

## 2022-05-31 MED ORDER — FENTANYL CITRATE (PF) 100 MCG/2ML IJ SOLN
INTRAMUSCULAR | Status: DC | PRN
  Administered 2022-05-31: 25 ug via INTRAVENOUS

## 2022-05-31 MED ORDER — METOPROLOL TARTRATE 5 MG/5ML IV SOLN
INTRAVENOUS | Status: AC
  Filled 2022-05-31: qty 5

## 2022-05-31 MED ORDER — SODIUM CHLORIDE 0.9 % WEIGHT BASED INFUSION: 1.0000 mL/kg/h | INTRAVENOUS | Status: DC

## 2022-05-31 MED ORDER — METOPROLOL TARTRATE 5 MG/5ML IV SOLN
INTRAVENOUS | Status: DC | PRN
  Administered 2022-05-31: 2.5 mg via INTRAVENOUS

## 2022-05-31 MED ORDER — AMITRIPTYLINE HCL 10 MG PO TABS
10.0000 mg | ORAL_TABLET | Freq: Every day | ORAL | Status: DC
  Administered 2022-05-31: 10 mg via ORAL
  Filled 2022-05-31: qty 1

## 2022-05-31 MED ORDER — SODIUM CHLORIDE 0.9% FLUSH: 3.0000 mL | INTRAVENOUS | Status: DC | PRN

## 2022-05-31 MED ORDER — LIDOCAINE HCL (PF) 1 % IJ SOLN
INTRAMUSCULAR | Status: DC | PRN
  Administered 2022-05-31: 2 mL

## 2022-05-31 MED ORDER — HEPARIN (PORCINE) IN NACL 1000-0.9 UT/500ML-% IV SOLN
INTRAVENOUS | Status: AC
  Filled 2022-05-31: qty 1000

## 2022-05-31 MED ORDER — METOPROLOL SUCCINATE ER 25 MG PO TB24
12.5000 mg | ORAL_TABLET | Freq: Every day | ORAL | Status: DC
  Administered 2022-06-01: 12.5 mg via ORAL
  Filled 2022-05-31: qty 0.5
  Filled 2022-05-31: qty 1

## 2022-05-31 MED ORDER — HEPARIN SODIUM (PORCINE) 1000 UNIT/ML IJ SOLN
INTRAMUSCULAR | Status: AC
  Filled 2022-05-31: qty 10

## 2022-05-31 MED ORDER — PRASUGREL HCL 10 MG PO TABS
ORAL_TABLET | ORAL | Status: DC | PRN
  Administered 2022-05-31: 60 mg via ORAL

## 2022-05-31 MED ORDER — FENTANYL CITRATE (PF) 100 MCG/2ML IJ SOLN
INTRAMUSCULAR | Status: AC
  Filled 2022-05-31: qty 2

## 2022-05-31 MED ORDER — NITROGLYCERIN 1 MG/10 ML FOR IR/CATH LAB
INTRA_ARTERIAL | Status: DC | PRN
  Administered 2022-05-31 (×4): 200 ug via INTRACORONARY

## 2022-05-31 MED ORDER — CYCLOBENZAPRINE HCL 10 MG PO TABS: 5.0000 mg | ORAL_TABLET | Freq: Three times a day (TID) | ORAL | Status: DC | PRN

## 2022-05-31 MED ORDER — SODIUM CHLORIDE 0.9 % IV SOLN: 250.0000 mL | INTRAVENOUS | Status: DC | PRN

## 2022-05-31 MED ORDER — ASPIRIN 81 MG PO CHEW
81.0000 mg | CHEWABLE_TABLET | Freq: Every day | ORAL | Status: DC
  Administered 2022-06-01: 81 mg via ORAL
  Filled 2022-05-31: qty 1

## 2022-05-31 MED ORDER — ACETAMINOPHEN 325 MG PO TABS
ORAL_TABLET | ORAL | Status: AC
  Filled 2022-05-31: qty 2

## 2022-05-31 MED ORDER — ONDANSETRON HCL 4 MG/2ML IJ SOLN: 4.0000 mg | Freq: Four times a day (QID) | INTRAMUSCULAR | Status: DC | PRN

## 2022-05-31 MED ORDER — VERAPAMIL HCL 2.5 MG/ML IV SOLN
INTRAVENOUS | Status: AC
  Filled 2022-05-31: qty 2

## 2022-05-31 MED ORDER — LIDOCAINE HCL 1 % IJ SOLN
INTRAMUSCULAR | Status: AC
  Filled 2022-05-31: qty 20

## 2022-05-31 MED ORDER — VERAPAMIL HCL 2.5 MG/ML IV SOLN
INTRAVENOUS | Status: DC | PRN
  Administered 2022-05-31 (×2): 2.5 mg via INTRA_ARTERIAL

## 2022-05-31 MED ORDER — HEPARIN SODIUM (PORCINE) 1000 UNIT/ML IJ SOLN
INTRAMUSCULAR | Status: DC | PRN
  Administered 2022-05-31: 7000 [IU] via INTRAVENOUS
  Administered 2022-05-31: 3000 [IU] via INTRAVENOUS
  Administered 2022-05-31: 5000 [IU] via INTRAVENOUS

## 2022-05-31 MED ORDER — ACETAMINOPHEN 325 MG PO TABS
650.0000 mg | ORAL_TABLET | ORAL | Status: DC | PRN
  Administered 2022-05-31 (×2): 650 mg via ORAL

## 2022-05-31 MED ORDER — ASPIRIN 81 MG PO CHEW
CHEWABLE_TABLET | ORAL | Status: AC
  Filled 2022-05-31: qty 3

## 2022-05-31 MED ORDER — SODIUM CHLORIDE 0.9% FLUSH
3.0000 mL | Freq: Two times a day (BID) | INTRAVENOUS | Status: DC
  Administered 2022-06-01: 3 mL via INTRAVENOUS

## 2022-05-31 MED ORDER — ASPIRIN 81 MG PO CHEW
81.0000 mg | CHEWABLE_TABLET | ORAL | Status: AC
  Administered 2022-05-31: 81 mg via ORAL

## 2022-05-31 MED ORDER — SODIUM CHLORIDE 0.9 % WEIGHT BASED INFUSION: 1.0000 mL/kg/h | INTRAVENOUS | Status: AC

## 2022-05-31 MED ORDER — HYDRALAZINE HCL 20 MG/ML IJ SOLN: 10.0000 mg | INTRAMUSCULAR | Status: AC | PRN

## 2022-05-31 MED ORDER — MIDAZOLAM HCL 2 MG/2ML IJ SOLN
INTRAMUSCULAR | Status: AC
  Filled 2022-05-31: qty 2

## 2022-05-31 MED ORDER — HEPARIN (PORCINE) IN NACL 1000-0.9 UT/500ML-% IV SOLN
INTRAVENOUS | Status: DC | PRN
  Administered 2022-05-31: 1000 mL
  Administered 2022-05-31: 500 mL

## 2022-05-31 MED ORDER — PRASUGREL HCL 10 MG PO TABS
ORAL_TABLET | ORAL | Status: AC
  Filled 2022-05-31: qty 6

## 2022-05-31 MED ORDER — OXYCODONE HCL 5 MG PO TABS
5.0000 mg | ORAL_TABLET | ORAL | Status: DC | PRN
  Administered 2022-05-31: 5 mg via ORAL
  Filled 2022-05-31: qty 1

## 2022-05-31 MED ORDER — IOHEXOL 300 MG/ML  SOLN
INTRAMUSCULAR | Status: DC | PRN
  Administered 2022-05-31: 266 mL

## 2022-05-31 MED ORDER — ASPIRIN 81 MG PO CHEW
CHEWABLE_TABLET | ORAL | Status: AC
  Filled 2022-05-31: qty 1

## 2022-05-31 MED ORDER — LABETALOL HCL 5 MG/ML IV SOLN: 10.0000 mg | INTRAVENOUS | Status: AC | PRN

## 2022-05-31 MED ORDER — PRASUGREL HCL 10 MG PO TABS
10.0000 mg | ORAL_TABLET | Freq: Every day | ORAL | Status: DC
  Administered 2022-06-01: 10 mg via ORAL
  Filled 2022-05-31: qty 1

## 2022-05-31 MED ORDER — ATORVASTATIN CALCIUM 80 MG PO TABS
80.0000 mg | ORAL_TABLET | Freq: Every day | ORAL | Status: DC
  Administered 2022-06-01: 80 mg via ORAL
  Filled 2022-05-31 (×3): qty 1

## 2022-05-31 MED ORDER — MIDAZOLAM HCL 2 MG/2ML IJ SOLN
INTRAMUSCULAR | Status: DC | PRN
  Administered 2022-05-31: 1 mg via INTRAVENOUS

## 2022-05-31 MED ORDER — SODIUM CHLORIDE 0.9 % WEIGHT BASED INFUSION
3.0000 mL/kg/h | INTRAVENOUS | Status: AC
  Administered 2022-05-31: 3 mL/kg/h via INTRAVENOUS

## 2022-05-31 MED ORDER — ASPIRIN 81 MG PO CHEW
CHEWABLE_TABLET | ORAL | Status: DC | PRN
  Administered 2022-05-31: 243 mg via ORAL

## 2022-05-31 SURGICAL SUPPLY — 22 items
BALLN TREK RX 2.25X15 (BALLOONS) ×1
BALLN ~~LOC~~ EUPHORA RX 2.75X20 (BALLOONS) ×1
BALLOON TREK RX 2.25X15 (BALLOONS) IMPLANT
BALLOON ~~LOC~~ EUPHORA RX 2.75X20 (BALLOONS) IMPLANT
BAND ZEPHYR COMPRESS 30 LONG (HEMOSTASIS) IMPLANT
CATH 5FR JL3.5 JR4 ANG PIG MP (CATHETERS) IMPLANT
CATH LAUNCHER 6FR EBU 3 (CATHETERS) IMPLANT
CATH LAUNCHER 6FR JL3.5 (CATHETERS) IMPLANT
CATH VISTA GUIDE 6FR XB3.5 (CATHETERS) IMPLANT
DRAPE BRACHIAL (DRAPES) IMPLANT
GLIDESHEATH SLEND SS 6F .021 (SHEATH) IMPLANT
GUIDEWIRE INQWIRE 1.5J.035X260 (WIRE) IMPLANT
INQWIRE 1.5J .035X260CM (WIRE) ×1
KIT ENCORE 26 ADVANTAGE (KITS) IMPLANT
PACK CARDIAC CATH (CUSTOM PROCEDURE TRAY) ×1 IMPLANT
PROTECTION STATION PRESSURIZED (MISCELLANEOUS) ×1
SET ATX SIMPLICITY (MISCELLANEOUS) IMPLANT
STATION PROTECTION PRESSURIZED (MISCELLANEOUS) IMPLANT
STENT ONYX FRONTIER 2.5X22 (Permanent Stent) IMPLANT
STENT ONYX FRONTIER 3.0X12 (Permanent Stent) IMPLANT
TUBING CIL FLEX 10 FLL-RA (TUBING) IMPLANT
WIRE ASAHI PROWATER 180CM (WIRE) IMPLANT

## 2022-05-31 NOTE — Discharge Summary (Addendum)
Discharge Summary      Patient ID: Suzanne Young MRN: [id number] DOB/AGE: [birth year] 61 y.o.  Admit date: 05/31/2022 Discharge date: 06/01/2022  Primary Discharge Diagnosis abnormal cardiac function test Secondary Discharge Diagnosis CAD with chest pain   Significant Diagnostic Studies:    05/31/22: Left heart cath and coronary angiography - Dr. Isaias Cowman    Mid RCA to Dist RCA lesion is 75% stenosed.   Dist Cx lesion is 70% stenosed.   LPAV lesion is 60% stenosed.   Mid LAD lesion is 99% stenosed.   Prox LAD lesion is 60% stenosed.   A drug-eluting stent was successfully placed using a STENT ONYX FRONTIER 2.5X22.   A drug-eluting stent was successfully placed using a STENT ONYX FRONTIER 3.0X12.   Post intervention, there is a 0% residual stenosis.   Post intervention, there is a 0% residual stenosis.   There is mild left ventricular systolic dysfunction.   The left ventricular ejection fraction is 45-50% by visual estimate.   1.  Three-vessel coronary artery disease with high-grade 99% stenosis mid LAD (culprit lesion), 75% stenosis distal codominant RCA, 70% stenosis distal left circumflex 2.  Mildly reduced left ventricular function with estimated LV ejection fraction 45 to 50% 3.  Successful PCI with overlapping DES x 2 proximal/mid LAD   Recommendations   1.  Dual antiplatelet therapy uninterrupted for 1 year 2.  Start atorvastatin 80 mg daily 3.  Enroll in cardiac rehabilitation 4.  If patient has recurrent chest pain consider PCI of distal RCA at a later date  Consults: none  Hospital Course: The patient was brought to the cardiac cath lab and underwent left heart catheterization and coronary angiography with Dr. Isaias Cowman on 05/31/22. The patient tolerated with procedure well without complications. The patient was found to have 3 vessel CAD as noted in the report above. The patient received 2 overlapping stents to the proximal / mid LAD  . On 06/01/2022 the right wrist arteriotomy site was examined and found to be without significant erythema, tenderness to palpation, or apparent aneurysm. Hospital course was overall uneventful, on day of discharge the patient was ambulatory and eager to go home.  Discussed cardiac rehab and new prescription medications in detail. The patient was given aftercare instructions and ER return precautions. Will arrange for follow up in office in 1 week, or sooner if needed.     Discharge Exam:  General: Pleasant middle-aged black female, well nourished, in no acute distress.  HEENT:  Normocephalic and atraumatic. Neck:   No JVD.  Lungs: Normal respiratory effort on room air.  Clear to ascultation bilaterally. Heart: HRRR . Normal S1 and S2 without gallops or murmurs.  Right radial pulse 2+  Abdomen: non-distended appearing.  Msk: Normal strength and tone for age. Extremities: No clubbing, cyanosis, edema.  Right wrist without tenderness to palpation, swelling, ecchymosis, gauze and Tegaderm in place. Neuro: Alert and oriented x3 Psych:  mood appropriate for situation.    Labs:   Lab Results  Component Value Date   WBC 4.3 06/01/2022   HGB 10.3 (L) 06/01/2022   HCT 30.8 (L) 06/01/2022   MCV 84.2 06/01/2022   PLT 266 06/01/2022    EKG: NSR nonspecific lateral TW abnormalities  FOLLOW UP PLANS AND APPOINTMENTS Discharge Instructions     AMB Referral to Cardiac Rehabilitation - Phase II   Complete by: As directed    Diagnosis: Coronary Stents   After initial evaluation and assessments completed: Virtual Based Care may be  provided alone or in conjunction with Phase 2 Cardiac Rehab based on patient barriers.: Yes   Intensive Cardiac Rehabilitation (ICR) [place] location only OR Traditional Cardiac Rehabilitation (TCR) *If criteria for ICR are not met will enroll in TCR [facility] only): Yes      Allergies as of 06/01/2022   No Known Allergies      Medication List     STOP taking these  medications    VITAMIN B 12 PO   VITAMIN C PO   Vitamin D 50 MCG (2000 UT) Caps       TAKE these medications    aspirin 81 MG chewable tablet Chew 1 tablet (81 mg total) by mouth daily.   atorvastatin 80 MG tablet Commonly known as: LIPITOR Take 1 tablet (80 mg total) by mouth daily.   CINNAMON PO Take 1 capsule by mouth 2 (two) times daily.   cyclobenzaprine 5 MG tablet Commonly known as: FLEXERIL Take 5 mg by mouth 3 (three) times daily as needed for muscle spasms.   diclofenac 75 MG EC tablet Commonly known as: VOLTAREN Take 75 mg by mouth 2 (two) times daily.   Dulaglutide 3 MG/0.5ML Sopn Inject 3 mg into the skin once a week.   gabapentin 100 MG capsule Commonly known as: NEURONTIN Take 100 mg by mouth 3 (three) times daily.   HYDROcodone-acetaminophen 5-325 MG tablet Commonly known as: NORCO/VICODIN Take 1 tablet by mouth every 6 (six) hours as needed for moderate pain.   ibuprofen 200 MG tablet Commonly known as: ADVIL Take 500 mg by mouth every 6 (six) hours as needed for moderate pain.   IRON 27 PO Take by mouth.   metoprolol succinate 25 MG 24 hr tablet Commonly known as: TOPROL-XL Take 0.5 tablets (12.5 mg total) by mouth daily.   multivitamin tablet Take 1 tablet by mouth daily.   prasugrel 10 MG Tabs tablet Commonly known as: EFFIENT Take 1 tablet (10 mg total) by mouth daily.        Follow-up Information     Paraschos, Alexander, MD. Go in 1 week(s).   Specialty: Cardiology Contact information: [facility] [place] [address] [phone]                 PLEASE BRING ALL MEDICATIONS WITH YOU TO FOLLOW UP APPOINTMENTS  This patient's plan of care was discussed and created with Dr. Saralyn Pilar and he is in agreement.    Time spent with patient: >30 minutes Signed:  Alanson Puls Genowefa Morga PA-C  06/01/2022, 8:17 AM  [facility] Cardiology

## 2022-05-31 NOTE — Plan of Care (Signed)

## 2022-05-31 NOTE — Progress Notes (Signed)
Dr. Saralyn Pilar in at bedside, speaking with pt. And her spouse Legrand Como re: PCI results. Both verbalized understanding of conversation.

## 2022-06-01 ENCOUNTER — Encounter: Payer: Self-pay | Admitting: Cardiology

## 2022-06-01 DIAGNOSIS — I2510 Atherosclerotic heart disease of native coronary artery without angina pectoris: Secondary | ICD-10-CM | POA: Diagnosis not present

## 2022-06-01 LAB — BASIC METABOLIC PANEL
Anion gap: 7 (ref 5–15)
BUN: 13 mg/dL (ref 8–23)
CO2: 25 mmol/L (ref 22–32)
Calcium: 8.5 mg/dL — ABNORMAL LOW (ref 8.9–10.3)
Chloride: 108 mmol/L (ref 98–111)
Creatinine, Ser: 0.53 mg/dL (ref 0.44–1.00)
GFR, Estimated: >60 mL/min (ref >60)
Glucose, Bld: 106 mg/dL — ABNORMAL HIGH (ref 70–99)
Potassium: 3.8 mmol/L (ref 3.5–5.1)
Sodium: 140 mmol/L (ref 135–145)

## 2022-06-01 LAB — CBC
HCT: 30.8 % — ABNORMAL LOW (ref 36.0–46.0)
Hemoglobin: 10.3 g/dL — ABNORMAL LOW (ref 12.0–15.0)
MCH: 28.1 pg (ref 26.0–34.0)
MCHC: 33.4 g/dL (ref 30.0–36.0)
MCV: 84.2 fL (ref 80.0–100.0)
Platelets: 266 10*3/uL (ref 150–400)
RBC: 3.66 MIL/uL — ABNORMAL LOW (ref 3.87–5.11)
RDW: 13.6 % (ref 11.5–15.5)
WBC: 4.3 10*3/uL (ref 4.0–10.5)
nRBC: 0 % (ref 0.0–0.2)

## 2022-06-01 MED ORDER — METOPROLOL SUCCINATE ER 25 MG PO TB24: 12.5000 mg | ORAL_TABLET | Freq: Every day | ORAL | 1 refills | Status: AC

## 2022-06-01 MED ORDER — ASPIRIN 81 MG PO CHEW: 81.0000 mg | CHEWABLE_TABLET | Freq: Every day | ORAL | 11 refills | Status: AC

## 2022-06-01 MED ORDER — PRASUGREL HCL 10 MG PO TABS: 10.0000 mg | ORAL_TABLET | Freq: Every day | ORAL | 11 refills | Status: AC

## 2022-06-01 MED ORDER — ATORVASTATIN CALCIUM 80 MG PO TABS: 80.0000 mg | ORAL_TABLET | Freq: Every day | ORAL | 11 refills | Status: AC

## 2022-06-01 NOTE — TOC CM/SW Note (Signed)
Patient has orders to discharge home today. Chart reviewed. No TOC needs identified. CSW signing off.  Tahmid Stonehocker, CSW [phone]  

## 2022-06-01 NOTE — Plan of Care (Incomplete)
Pt. A&OX4, no c/o pain. D/C IV and tele. No c/o of pain or bleeding  at site. Pt given Med/Rec and reviewed , no extra questions at this time. Follow up reviewed with no questions.

## 2022-06-02 LAB — LIPOPROTEIN A (LPA): Lipoprotein (a): 200.4 nmol/L — ABNORMAL HIGH (ref <75.0)

## 2022-07-09 ENCOUNTER — Other Ambulatory Visit: Payer: Self-pay | Admitting: Family Medicine

## 2022-07-09 DIAGNOSIS — Z1231 Encounter for screening mammogram for malignant neoplasm of breast: Secondary | ICD-10-CM

## 2022-08-28 ENCOUNTER — Ambulatory Visit
Admission: RE | Admit: 2022-08-28 | Discharge: 2022-08-28 | Disposition: A | Discharge status: Home / Self Care | Payer: Federal, State, Local not specified - PPO | Source: Ambulatory Visit | Attending: Family Medicine | Admitting: Family Medicine

## 2022-08-28 DIAGNOSIS — Z1231 Encounter for screening mammogram for malignant neoplasm of breast: Secondary | ICD-10-CM | POA: Diagnosis not present

## 2023-07-29 ENCOUNTER — Other Ambulatory Visit: Payer: Self-pay | Admitting: Family Medicine

## 2023-07-29 DIAGNOSIS — Z1231 Encounter for screening mammogram for malignant neoplasm of breast: Secondary | ICD-10-CM

## 2023-09-17 ENCOUNTER — Ambulatory Visit
Admission: RE | Admit: 2023-09-17 | Discharge: 2023-09-17 | Disposition: A | Discharge status: Home / Self Care | Payer: Federal, State, Local not specified - PPO | Source: Ambulatory Visit | Attending: Family Medicine | Admitting: Family Medicine

## 2023-09-17 DIAGNOSIS — Z1231 Encounter for screening mammogram for malignant neoplasm of breast: Secondary | ICD-10-CM | POA: Insufficient documentation

## 2023-10-11 ENCOUNTER — Other Ambulatory Visit: Payer: Self-pay | Admitting: Specialist

## 2023-10-11 DIAGNOSIS — M5412 Radiculopathy, cervical region: Secondary | ICD-10-CM

## 2023-10-11 DIAGNOSIS — R4182 Altered mental status, unspecified: Secondary | ICD-10-CM

## 2023-10-22 ENCOUNTER — Encounter: Payer: Self-pay | Admitting: Specialist

## 2023-10-28 ENCOUNTER — Ambulatory Visit
Admission: RE | Admit: 2023-10-28 | Discharge: 2023-10-28 | Disposition: A | Discharge status: Home / Self Care | Payer: Worker's Compensation | Source: Ambulatory Visit | Attending: Specialist | Admitting: Specialist

## 2023-10-28 ENCOUNTER — Ambulatory Visit
Admission: RE | Admit: 2023-10-28 | Discharge: 2023-10-28 | Disposition: A | Discharge status: Home / Self Care | Source: Ambulatory Visit | Attending: Specialist | Admitting: Specialist

## 2023-10-28 DIAGNOSIS — R4182 Altered mental status, unspecified: Secondary | ICD-10-CM

## 2023-10-28 DIAGNOSIS — M5412 Radiculopathy, cervical region: Secondary | ICD-10-CM

## 2024-06-19 NOTE — Progress Notes (Signed)
 Chief Complaint:   Chief Complaint  Patient presents with   Diabetes   Hyperlipidemia   CAD    Subjective:   HPI Suzanne Young is a 63 y.o. 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 No side effects reported. Blood glucose control status is pending as A1c results are not yet available. - Increase Tirzepatide (Mounjaro) to 5 mg subcutaneously once a week after current supply is finished - Order blood work to check A1c - Reassess diabetes management in 3 months   Primary hypertension Blood pressure is well-controlled at this visit.   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   - Continue to use Valacyclovir as needed for recurrent episodes - Educate on starting Valacyclovir at the first sign of symptoms   Intertrigo Intertrigo under the breast, likely due to moisture and friction. Managed effectively with diaper rash cream. - Continue using diaper rash cream as needed - Advise to keep the area dry and wear a bra to minimize skin friction   Insomnia Insomnia persists despite trazodone prescription. She reports nausea when taking trazodone, possibly due to concurrent medication use.   Migraine Workman's compensation has not approved recommended treatments. - Continue to pursue Nurtec approval through [employer]   Severe Obesity BMI 37.88 -recommend weight loss, 150 minutes of moderate intensity exercise per week, 1400 calorie restriction per day, a minimum of 60 grams of protein per day, and a minimum of three meals per day.  Sugar/glucose is elevated at 191.  Hemoglobin A1c is under good control at 6.5. Liver and kidney functions are  normal.  Medical events since last visit: -cardiology return visit 06/17/24; no changes.  Update since last visit: History of Present Illness Suzanne Young is a 63 year old female with diabetes who presents for follow-up on her diabetes management.  Her A1c is currently 5.7. She is on a higher dose of Muja and experiences no side effects, although she has not experienced any weight loss. Her appetite remains unaffected by the medication, and she often feels very hungry by dinner time.  She has a history of constipation, which she attributes to medications taken after an accident. Bowel movements are irregular, occurring daily or every three to four days. She uses a Lipro device to help with constipation, which works the lower part of her body. No heartburn is present.  She experiences increased head and neck pain with certain activities, limiting her ability to exercise. Standing up can exacerbate her head and neck pain. Her current medications include gabapentin at 800 mg and tizanidine at 6 mg, which have been increased to help manage her symptoms. She also takes Nubrably for headaches, which she finds helpful.  No chest pain, shortness of breath, or swelling. She has a colonoscopy scheduled for March and has declined a flu shot and other vaccines at this time.   Lab Results  Component Value Date   HGBA1C 5.7 (H) 06/19/2024   HGBA1C 6.5 (H) 03/11/2024   Lab Results  Component Value Date   CREATININE 0.7 03/11/2024   Last Lipids: Lab Results  Component Value Date   CHOLTOTAL 206 09/11/2023   LDLCALC 135 09/11/2023   HDL 42 09/11/2023   TRIG 145 09/11/2023   Lab Results  Component Value Date   TSH  1.52 09/11/2023   Lab Results  Component Value Date   WBC 4.5 09/11/2023   HGB 11.8 09/11/2023   HCT 36.5 09/11/2023   MCV 87 09/11/2023   PLT 440 09/11/2023    BP Readings from Last 3 Encounters:  06/19/24 133/83  06/17/24 138/80  03/11/24 127/81   Wt Readings  from Last 10 Encounters:  06/19/24 98.3 kg (216 lb 12.8 oz)  06/17/24 98.9 kg (218 lb)  03/11/24 100.1 kg (220 lb 10.9 oz)  12/17/23 97.3 kg (214 lb 6.4 oz)  12/13/23 98.6 kg (217 lb 6 oz)  09/11/23 100.1 kg (220 lb 10.9 oz)  06/11/23 96.3 kg (212 lb 3.2 oz)  04/09/23 96.9 kg (213 lb 10 oz)  02/13/23 94.3 kg (208 lb)  12/04/22 95.9 kg (211 lb 6.7 oz)    Allergies  Allergen Reactions   Metformin  Other (See Comments)    CONSTIPATION     Objective:   Vitals:   06/19/24 1506 06/19/24 1515  BP: (!) 148/81 133/83  Pulse: 89   Weight: 98.3 kg (216 lb 12.8 oz)   Height: 162.6 cm (5' 4.02)   PainSc:   6   PainLoc: Head - Entire    Body mass index is 37.2 kg/m. Physical Exam Constitutional:      General: She is not in acute distress.    Appearance: Normal appearance. She is well-developed. She is not diaphoretic.  HENT:     Right Ear: External ear normal.     Left Ear: External ear normal.  Eyes:     Extraocular Movements: Extraocular movements intact.     Conjunctiva/sclera: Conjunctivae normal.     Pupils: Pupils are equal, round, and reactive to light.  Neck:     Thyroid : No thyromegaly.  Cardiovascular:     Rate and Rhythm: Normal rate and regular rhythm.     Heart sounds: Normal heart sounds. No murmur heard.    No friction rub. No gallop.  Pulmonary:     Effort: Pulmonary effort is normal. No respiratory distress.     Breath sounds: Normal breath sounds. No decreased breath sounds, wheezing, rhonchi or rales.  Musculoskeletal:     Cervical back: Normal range of motion and neck supple.     Right lower leg: No edema.     Left lower leg: No edema.  Lymphadenopathy:     Cervical: No cervical adenopathy.  Skin:    General: Skin is warm and dry.     Capillary Refill: Capillary refill takes less than 2 seconds.     Findings: No rash.  Neurological:     General: No focal deficit present.     Mental Status: She is alert and oriented to person, place, and time.  Mental status is at baseline.     Motor: Motor function is intact. No abnormal muscle tone.     Coordination: Coordination normal.  Psychiatric:        Attention and Perception: Attention and perception normal.        Mood and Affect: Mood and affect normal.        Speech: Speech normal.        Behavior: Behavior normal.        Thought Content: Thought content normal.        Cognition and Memory: Cognition normal.        Judgment: Judgment normal.       PHQ 2/9 last 3 flowsheet values     04/13/2021 09/04/2022 09/11/2023  PHQ-9 Depression  Screening   Little interest or pleasure in doing things   0  Feeling down, depressed, or hopeless   0  (OBSOLETE) Little interest or pleasure in doing things 2 2   (OBSOLETE) Feeling down, depressed, or hopeless (or irritable for Teens only)? 0 0   (OBSOLETE) Trouble falling or staying asleep, or sleeping too much? 3 3   (OBSOLETE) Feeling tired or having little energy? 3 3   (OBSOLETE) Poor appetite or overeating? 3 3   (OBSOLETE) Feeling bad about yourself - or that you are a failure or have let yourself or your family down? 0 0   (OBSOLETE) Trouble concentrating on things, such as reading the newspaper or watching television? 3 2   (OBSOLETE) Moving or speaking so slowly that other people could have noticed?  Or the opposite - being so fidgety or restless that you have been moving around a lot more than usual? 2 0   (OBSOLETE) Thoughts that you would be better off dead, or of hurting yourself in some way? 0 0   (OBSOLETE) How difficult have these problems made it for you do your work, take care of things at home, or get along with people? Extremely difficult Extremely difficult   (OBSOLETE) Total Score = 16 13       Depression Severity and Treatment Recommendations:  0-4= None  5-9= Mild / Treatment: Support, educate to call if worse; return in one month  10-14= Moderate / Treatment: Support, watchful waiting; Antidepressant or Psychotherapy   15-19= Moderately severe / Treatment: Antidepressant OR Psychotherapy  >= 20 = Major depression, severe / Antidepressant AND Psychotherapy GAD 7 result:     04/13/2021 09/04/2022  GAD-7 w/ Score  (OBSOLETE) Over the past 2 weeks, have you felt nervous, anxious, or on edge? Several days * More than half the days *  (OBSOLETE) Over the past 2 weeks, have you not been able to stop or control worrying? Not at all * Several days *  (OBSOLETE) Worrying too much about different things Not at all * Several days *  (OBSOLETE) Trouble relaxing Nearly every day * Several days *  (OBSOLETE) Being so restless that it is hard to sit still Not at all * Not at all *  (OBSOLETE) Becoming easily annoyed or irritable Nearly every day * More than half the days *  (OBSOLETE) Feeling afraid as if something awful might happen Nearly every day * More than half the days *  (OBSOLETE) GAD-7 Total Score 10 * 9 *  --      * Data saved with a previous flowsheet row definition     Anxiety Severity:     0-4 = Minimal Anxiety     5-9 = Mild Anxiety 10-14 = Moderate Anxiety 15-21 = Severe Anxiety Assessment/Plan:   Diagnoses and all orders for this visit:  Type 2 diabetes mellitus with other circulatory complication, without long-term current use of insulin (CMS/HHS-HCC) -     DPC POC Hemoglobin A1C; Future -     Albumin/Creatinine Ratio, Random Urine -     tirzepatide (MOUNJARO) 12.5 mg/0.5 mL pen injector; Inject 0.5 mLs (12.5 mg total) subcutaneously once a week  Pure hypercholesterolemia  Metabolic dysfunction-associated steatotic liver  Coronary artery disease involving native coronary artery of native heart without angina pectoris  Intractable chronic post-traumatic headache  Chronic neck pain  Other orders -     Follow up in Primary Care -     Follow up in Primary Care; Future  Assessment & Plan Type 2 diabetes mellitus with circulatory complications Type 2 diabetes is well-controlled with  an A1c of 5.7%. No side effects from current medication regimen. Constipation is a chronic issue, likely pre-existing and not related to diabetes management. - Increase Tirzepatide (Mounjaro) 12.5 mg subcutaneous once a week. - Ordered urine test to check for proteinuria. - Scheduled fasting blood work for February to check cholesterol levels.  Hypercholesterolemia Controlled; no changes to management.  Coronary artery disease s/p stenting -stable per patient report and review of record.  Followed by specialist.  Continue follow-up as recommended.  Metabolic dysfunction fatty liver -recommend weight loss, 150 minutes of moderate intensity exercise per week, 1200 calorie restriction per day, a minimum of 60 grams of protein per day, and a minimum of three meals per day.  Severe obesity Weight loss has been minimal despite current medication. Appetite is not significantly affected by medication. She is motivated to continue weight loss efforts. - Increased Tirzepatide (Mounjaro) to the next dose to minimize side effects while attempting to enhance weight loss. - Encouraged continued weight loss efforts. --recommend weight loss, 150 minutes of moderate intensity exercise per week, 1200 calorie restriction per day, a minimum of 60 grams of protein per day, and a minimum of three meals per day.  Constipation Chronic constipation, likely pre-existing and not related to current medication. Bowel movements are irregular, sometimes occurring daily and other times every three to four days. - Continue current management and monitor bowel habits.  This note has been created using automated tools and reviewed for accuracy by [facility].  Future Appointments     Date/Time Provider Department Center Visit Type   09/22/2024 3:30 PM (Arrive by 3:15 PM) Claudene Rayfield Lunger, MD [facility] PHYSICAL   10/07/2024 10:30 AM Mevelyn Romero Pander, PA [facility] NEW PATIENT   12/16/2024 2:15 PM Paraschos, Marsa, MD [facility] FOLLOW UP      Follow up in 3 months (on 09/19/2024). Follow-up     Normal Orders This Visit   Follow up in Primary Care [[id number] Custom]    Questions:   Does this order need to be coordinated with another visit or should it be hidden from the patient portal? If yes to either, the patient will need to stop by the front desk or call to schedule.: No   Who is this follow-up with?: Me   Can this appointment be overbooked by a scheduler?: No   What type of follow up is needed?: Office Visit   What's the reason for follow up?: Diabetes   Allow telemedicine?: In Person Only    Future Labs/Procedures Expected by Expires   Follow up in Primary Care [[id number] Custom]  09/19/2024 06/19/2025   Questions:     Does this order need to be coordinated with another visit or should it be hidden from the patient portal? If yes to either, the patient will need to stop by the front desk or call to schedule.: No   Who is this follow-up with?: PCP   What type of follow up is needed?: Physical   What's the reason for follow up?:            Rayfield Lunger Claudene, M.D. [facility] [address] [place], [place] [address] Office: [phone] *Some images could not be shown.

## 2024-07-24 ENCOUNTER — Other Ambulatory Visit: Payer: Self-pay | Admitting: Family Medicine

## 2024-07-24 ENCOUNTER — Ambulatory Visit

## 2024-07-24 DIAGNOSIS — M7989 Other specified soft tissue disorders: Secondary | ICD-10-CM

## 2024-07-27 ENCOUNTER — Other Ambulatory Visit: Payer: Self-pay | Admitting: Family Medicine

## 2024-07-27 DIAGNOSIS — D1721 Benign lipomatous neoplasm of skin and subcutaneous tissue of right arm: Secondary | ICD-10-CM

## 2024-07-27 DIAGNOSIS — M7989 Other specified soft tissue disorders: Secondary | ICD-10-CM

## 2024-08-03 ENCOUNTER — Ambulatory Visit
Admission: RE | Admit: 2024-08-03 | Discharge: 2024-08-03 | Disposition: A | Discharge status: Home / Self Care | Source: Ambulatory Visit | Attending: Family Medicine | Admitting: Family Medicine

## 2024-08-03 DIAGNOSIS — D1721 Benign lipomatous neoplasm of skin and subcutaneous tissue of right arm: Secondary | ICD-10-CM | POA: Insufficient documentation

## 2024-08-03 DIAGNOSIS — M7989 Other specified soft tissue disorders: Secondary | ICD-10-CM | POA: Insufficient documentation

## 2024-08-19 ENCOUNTER — Other Ambulatory Visit: Payer: Self-pay | Admitting: Family Medicine

## 2024-08-19 DIAGNOSIS — Z1231 Encounter for screening mammogram for malignant neoplasm of breast: Secondary | ICD-10-CM

## 2024-09-17 ENCOUNTER — Encounter
